# Patient Record
Sex: Female | Born: 1985 | Race: White | Hispanic: No | Marital: Single | Smoking: Current every day smoker
Health system: Southern US, Community
[De-identification: ages and names within clinical notes are randomized; demographics above are authoritative.]

## PROBLEM LIST (undated history)

## (undated) DIAGNOSIS — F319 Bipolar disorder, unspecified: Secondary | ICD-10-CM

## (undated) DIAGNOSIS — K5792 Diverticulitis of intestine, part unspecified, without perforation or abscess without bleeding: Secondary | ICD-10-CM

## (undated) DIAGNOSIS — N159 Renal tubulo-interstitial disease, unspecified: Secondary | ICD-10-CM

## (undated) DIAGNOSIS — N739 Female pelvic inflammatory disease, unspecified: Secondary | ICD-10-CM

## (undated) DIAGNOSIS — F419 Anxiety disorder, unspecified: Secondary | ICD-10-CM

---

## 2003-12-22 ENCOUNTER — Emergency Department: Payer: Self-pay | Admitting: Emergency Medicine

## 2004-07-24 ENCOUNTER — Emergency Department: Payer: Self-pay | Admitting: Emergency Medicine

## 2004-10-13 ENCOUNTER — Emergency Department (HOSPITAL_COMMUNITY): Admission: EM | Admit: 2004-10-13 | Discharge: 2004-10-13 | Payer: Self-pay | Admitting: Emergency Medicine

## 2004-11-20 ENCOUNTER — Emergency Department (HOSPITAL_COMMUNITY): Admission: EM | Admit: 2004-11-20 | Discharge: 2004-11-20 | Payer: Self-pay | Admitting: Emergency Medicine

## 2008-04-18 ENCOUNTER — Emergency Department: Payer: Self-pay | Admitting: Emergency Medicine

## 2008-08-30 ENCOUNTER — Inpatient Hospital Stay: Payer: Self-pay | Admitting: Psychiatry

## 2009-01-02 ENCOUNTER — Inpatient Hospital Stay: Payer: Self-pay | Admitting: Psychiatry

## 2009-04-17 ENCOUNTER — Emergency Department (HOSPITAL_COMMUNITY): Admission: EM | Admit: 2009-04-17 | Discharge: 2009-04-17 | Payer: Self-pay | Admitting: Emergency Medicine

## 2009-06-02 ENCOUNTER — Emergency Department (HOSPITAL_COMMUNITY): Admission: EM | Admit: 2009-06-02 | Discharge: 2009-06-03 | Payer: Self-pay | Admitting: Emergency Medicine

## 2015-04-19 ENCOUNTER — Ambulatory Visit: Payer: Self-pay | Admitting: Family Medicine

## 2016-10-07 ENCOUNTER — Emergency Department
Admission: EM | Admit: 2016-10-07 | Discharge: 2016-10-07 | Disposition: A | Discharge status: Other Acute Inpatient Hospital | Payer: Medicaid Other | Attending: Emergency Medicine | Admitting: Emergency Medicine

## 2016-10-07 ENCOUNTER — Inpatient Hospital Stay
Admission: AD | Admit: 2016-10-07 | Discharge: 2016-10-09 | DRG: 885 | Disposition: A | Discharge status: Home / Self Care | Payer: Medicaid Other | Attending: Psychiatry | Admitting: Psychiatry

## 2016-10-07 ENCOUNTER — Encounter: Payer: Self-pay | Admitting: Emergency Medicine

## 2016-10-07 DIAGNOSIS — F1121 Opioid dependence, in remission: Secondary | ICD-10-CM | POA: Diagnosis present

## 2016-10-07 DIAGNOSIS — F1020 Alcohol dependence, uncomplicated: Secondary | ICD-10-CM

## 2016-10-07 DIAGNOSIS — Z79899 Other long term (current) drug therapy: Secondary | ICD-10-CM | POA: Diagnosis not present

## 2016-10-07 DIAGNOSIS — R45851 Suicidal ideations: Secondary | ICD-10-CM | POA: Insufficient documentation

## 2016-10-07 DIAGNOSIS — F1420 Cocaine dependence, uncomplicated: Secondary | ICD-10-CM | POA: Diagnosis present

## 2016-10-07 DIAGNOSIS — Z9119 Patient's noncompliance with other medical treatment and regimen: Secondary | ICD-10-CM | POA: Diagnosis not present

## 2016-10-07 DIAGNOSIS — F1310 Sedative, hypnotic or anxiolytic abuse, uncomplicated: Secondary | ICD-10-CM

## 2016-10-07 DIAGNOSIS — R079 Chest pain, unspecified: Secondary | ICD-10-CM | POA: Diagnosis present

## 2016-10-07 DIAGNOSIS — F17200 Nicotine dependence, unspecified, uncomplicated: Secondary | ICD-10-CM | POA: Insufficient documentation

## 2016-10-07 DIAGNOSIS — Z915 Personal history of self-harm: Secondary | ICD-10-CM

## 2016-10-07 DIAGNOSIS — F1111 Opioid abuse, in remission: Secondary | ICD-10-CM | POA: Diagnosis present

## 2016-10-07 DIAGNOSIS — F17210 Nicotine dependence, cigarettes, uncomplicated: Secondary | ICD-10-CM | POA: Diagnosis present

## 2016-10-07 DIAGNOSIS — F10920 Alcohol use, unspecified with intoxication, uncomplicated: Secondary | ICD-10-CM | POA: Diagnosis not present

## 2016-10-07 DIAGNOSIS — F1290 Cannabis use, unspecified, uncomplicated: Secondary | ICD-10-CM | POA: Diagnosis present

## 2016-10-07 DIAGNOSIS — Z713 Dietary counseling and surveillance: Secondary | ICD-10-CM

## 2016-10-07 DIAGNOSIS — G4700 Insomnia, unspecified: Secondary | ICD-10-CM | POA: Diagnosis present

## 2016-10-07 DIAGNOSIS — T50902A Poisoning by unspecified drugs, medicaments and biological substances, intentional self-harm, initial encounter: Secondary | ICD-10-CM | POA: Diagnosis not present

## 2016-10-07 DIAGNOSIS — Z23 Encounter for immunization: Secondary | ICD-10-CM | POA: Diagnosis not present

## 2016-10-07 DIAGNOSIS — F314 Bipolar disorder, current episode depressed, severe, without psychotic features: Secondary | ICD-10-CM

## 2016-10-07 DIAGNOSIS — Z888 Allergy status to other drugs, medicaments and biological substances status: Secondary | ICD-10-CM

## 2016-10-07 DIAGNOSIS — R946 Abnormal results of thyroid function studies: Secondary | ICD-10-CM | POA: Diagnosis present

## 2016-10-07 DIAGNOSIS — F419 Anxiety disorder, unspecified: Secondary | ICD-10-CM | POA: Diagnosis present

## 2016-10-07 DIAGNOSIS — Z5181 Encounter for therapeutic drug level monitoring: Secondary | ICD-10-CM

## 2016-10-07 HISTORY — DX: Bipolar disorder, unspecified: F31.9

## 2016-10-07 HISTORY — DX: Anxiety disorder, unspecified: F41.9

## 2016-10-07 LAB — COMPREHENSIVE METABOLIC PANEL
ALBUMIN: 4.1 g/dL (ref 3.5–5.0)
ALK PHOS: 54 U/L (ref 38–126)
ALT: 20 U/L (ref 14–54)
ANION GAP: 13 (ref 5–15)
AST: 19 U/L (ref 15–41)
BUN: 11 mg/dL (ref 6–20)
CALCIUM: 9.1 mg/dL (ref 8.9–10.3)
CHLORIDE: 109 mmol/L (ref 101–111)
CO2: 21 mmol/L — AB (ref 22–32)
CREATININE: 0.61 mg/dL (ref 0.44–1.00)
GFR calc non Af Amer: >60 mL/min (ref >60)
GLUCOSE: 113 mg/dL — AB (ref 65–99)
Potassium: 3.4 mmol/L — ABNORMAL LOW (ref 3.5–5.1)
SODIUM: 143 mmol/L (ref 135–145)
Total Bilirubin: 0.4 mg/dL (ref 0.3–1.2)
Total Protein: 7.6 g/dL (ref 6.5–8.1)

## 2016-10-07 LAB — CBC
HCT: 39.5 % (ref 35.0–47.0)
Hemoglobin: 13.5 g/dL (ref 12.0–16.0)
MCH: 32.5 pg (ref 26.0–34.0)
MCHC: 34.1 g/dL (ref 32.0–36.0)
MCV: 95.3 fL (ref 80.0–100.0)
PLATELETS: 578 10*3/uL — AB (ref 150–440)
RBC: 4.14 MIL/uL (ref 3.80–5.20)
RDW: 12.5 % (ref 11.5–14.5)
WBC: 14.4 10*3/uL — ABNORMAL HIGH (ref 3.6–11.0)

## 2016-10-07 LAB — SALICYLATE LEVEL

## 2016-10-07 LAB — URINE DRUG SCREEN, QUALITATIVE (ARMC ONLY)
AMPHETAMINES, UR SCREEN: NOT DETECTED
BENZODIAZEPINE, UR SCRN: NOT DETECTED
Barbiturates, Ur Screen: NOT DETECTED
Cannabinoid 50 Ng, Ur ~~LOC~~: NOT DETECTED
Cocaine Metabolite,Ur ~~LOC~~: POSITIVE — AB
MDMA (ECSTASY) UR SCREEN: NOT DETECTED
METHADONE SCREEN, URINE: NOT DETECTED
Opiate, Ur Screen: NOT DETECTED
Phencyclidine (PCP) Ur S: NOT DETECTED
Tricyclic, Ur Screen: NOT DETECTED

## 2016-10-07 LAB — ETHANOL: Alcohol, Ethyl (B): 301 mg/dL (ref <5)

## 2016-10-07 LAB — ACETAMINOPHEN LEVEL

## 2016-10-07 LAB — PREGNANCY, URINE: PREG TEST UR: NEGATIVE

## 2016-10-07 LAB — TROPONIN I

## 2016-10-07 MED ORDER — LORAZEPAM 1 MG PO TABS
1.0000 mg | ORAL_TABLET | Freq: Four times a day (QID) | ORAL | Status: DC | PRN
  Administered 2016-10-07: 1 mg via ORAL
  Filled 2016-10-07: qty 1

## 2016-10-07 MED ORDER — BUPRENORPHINE HCL-NALOXONE HCL 8-2 MG SL SUBL
1.0000 | SUBLINGUAL_TABLET | SUBLINGUAL | Status: AC
  Filled 2016-10-07: qty 4

## 2016-10-07 MED ORDER — NICOTINE 21 MG/24HR TD PT24
21.0000 mg | MEDICATED_PATCH | Freq: Every day | TRANSDERMAL | Status: DC
  Administered 2016-10-08 – 2016-10-09 (×2): 21 mg via TRANSDERMAL
  Filled 2016-10-07 (×2): qty 1

## 2016-10-07 MED ORDER — BUPRENORPHINE HCL-NALOXONE HCL 8-2 MG SL SUBL: 1.0000 | SUBLINGUAL_TABLET | Freq: Two times a day (BID) | SUBLINGUAL | Status: DC

## 2016-10-07 MED ORDER — QUETIAPINE FUMARATE 25 MG PO TABS
100.0000 mg | ORAL_TABLET | Freq: Every day | ORAL | Status: DC
  Administered 2016-10-07: 100 mg via ORAL
  Filled 2016-10-07: qty 4

## 2016-10-07 MED ORDER — BUPRENORPHINE HCL-NALOXONE HCL 8-2 MG SL SUBL
1.0000 | SUBLINGUAL_TABLET | SUBLINGUAL | Status: DC
Start: 2016-10-07 — End: 2016-10-07
  Administered 2016-10-07: 1 via SUBLINGUAL
  Filled 2016-10-07: qty 1

## 2016-10-07 MED ORDER — ALUM & MAG HYDROXIDE-SIMETH 200-200-20 MG/5ML PO SUSP
30.0000 mL | ORAL | Status: DC | PRN
  Administered 2016-10-08: 30 mL via ORAL
  Filled 2016-10-07: qty 30

## 2016-10-07 MED ORDER — VITAMIN B-1 100 MG PO TABS
100.0000 mg | ORAL_TABLET | Freq: Every day | ORAL | Status: DC
  Administered 2016-10-08 – 2016-10-09 (×2): 100 mg via ORAL
  Filled 2016-10-07 (×2): qty 1

## 2016-10-07 MED ORDER — MAGNESIUM HYDROXIDE 400 MG/5ML PO SUSP: 30.0000 mL | Freq: Every day | ORAL | Status: DC | PRN

## 2016-10-07 MED ORDER — TRAZODONE HCL 100 MG PO TABS
100.0000 mg | ORAL_TABLET | Freq: Every evening | ORAL | Status: DC | PRN
  Administered 2016-10-08: 100 mg via ORAL
  Filled 2016-10-07: qty 1

## 2016-10-07 MED ORDER — GABAPENTIN 300 MG PO CAPS
900.0000 mg | ORAL_CAPSULE | Freq: Three times a day (TID) | ORAL | Status: DC
  Administered 2016-10-07 (×2): 900 mg via ORAL
  Filled 2016-10-07 (×2): qty 3

## 2016-10-07 MED ORDER — LORAZEPAM 2 MG/ML IJ SOLN: 1.0000 mg | Freq: Four times a day (QID) | INTRAMUSCULAR | Status: DC | PRN

## 2016-10-07 MED ORDER — GABAPENTIN 300 MG PO CAPS
900.0000 mg | ORAL_CAPSULE | Freq: Three times a day (TID) | ORAL | Status: DC
  Administered 2016-10-08 – 2016-10-09 (×6): 900 mg via ORAL
  Filled 2016-10-07 (×6): qty 3

## 2016-10-07 MED ORDER — ADULT MULTIVITAMIN W/MINERALS CH
1.0000 | ORAL_TABLET | Freq: Every day | ORAL | Status: DC
  Administered 2016-10-07: 1 via ORAL
  Filled 2016-10-07: qty 1

## 2016-10-07 MED ORDER — LITHIUM CARBONATE ER 300 MG PO TBCR
300.0000 mg | EXTENDED_RELEASE_TABLET | Freq: Three times a day (TID) | ORAL | Status: DC
  Administered 2016-10-08 – 2016-10-09 (×6): 300 mg via ORAL
  Filled 2016-10-07 (×6): qty 1

## 2016-10-07 MED ORDER — LORAZEPAM 1 MG PO TABS: 1.0000 mg | ORAL_TABLET | Freq: Four times a day (QID) | ORAL | Status: DC | PRN

## 2016-10-07 MED ORDER — THIAMINE HCL 100 MG/ML IJ SOLN
100.0000 mg | Freq: Once | INTRAMUSCULAR | Status: AC
  Administered 2016-10-07: 100 mg via INTRAMUSCULAR
  Filled 2016-10-07: qty 2

## 2016-10-07 MED ORDER — THIAMINE HCL 100 MG/ML IJ SOLN: 100.0000 mg | Freq: Every day | INTRAMUSCULAR | Status: DC

## 2016-10-07 MED ORDER — BUPRENORPHINE HCL-NALOXONE HCL 8-2 MG SL SUBL
1.0000 | SUBLINGUAL_TABLET | Freq: Two times a day (BID) | SUBLINGUAL | Status: DC
  Administered 2016-10-07: 2 via SUBLINGUAL
  Administered 2016-10-08 – 2016-10-09 (×4): 1 via SUBLINGUAL
  Filled 2016-10-07 (×4): qty 1

## 2016-10-07 MED ORDER — VITAMIN B-1 100 MG PO TABS: 100.0000 mg | ORAL_TABLET | Freq: Every day | ORAL | Status: DC

## 2016-10-07 MED ORDER — ACETAMINOPHEN 325 MG PO TABS
650.0000 mg | ORAL_TABLET | Freq: Four times a day (QID) | ORAL | Status: DC | PRN
  Administered 2016-10-07 – 2016-10-09 (×3): 650 mg via ORAL
  Filled 2016-10-07 (×3): qty 2

## 2016-10-07 MED ORDER — LORAZEPAM 2 MG PO TABS
2.0000 mg | ORAL_TABLET | ORAL | Status: AC
  Administered 2016-10-07: 2 mg via ORAL
  Filled 2016-10-07: qty 1

## 2016-10-07 MED ORDER — FOLIC ACID 1 MG PO TABS
1.0000 mg | ORAL_TABLET | Freq: Every day | ORAL | Status: DC
  Administered 2016-10-07: 1 mg via ORAL
  Filled 2016-10-07: qty 1

## 2016-10-07 MED ORDER — NICOTINE 21 MG/24HR TD PT24
21.0000 mg | MEDICATED_PATCH | Freq: Every day | TRANSDERMAL | Status: DC
  Administered 2016-10-07: 21 mg via TRANSDERMAL
  Filled 2016-10-07: qty 1

## 2016-10-07 MED ORDER — ADULT MULTIVITAMIN W/MINERALS CH
1.0000 | ORAL_TABLET | Freq: Every day | ORAL | Status: DC
  Administered 2016-10-08 – 2016-10-09 (×2): 1 via ORAL
  Filled 2016-10-07 (×2): qty 1

## 2016-10-07 MED ORDER — SODIUM CHLORIDE 0.9 % IV BOLUS (SEPSIS)
1000.0000 mL | Freq: Once | INTRAVENOUS | Status: AC
  Administered 2016-10-07: 1000 mL via INTRAVENOUS

## 2016-10-07 MED ORDER — DIPHENHYDRAMINE HCL 25 MG PO CAPS
25.0000 mg | ORAL_CAPSULE | Freq: Once | ORAL | Status: AC
  Administered 2016-10-07: 25 mg via ORAL
  Filled 2016-10-07: qty 1

## 2016-10-07 MED ORDER — LITHIUM CARBONATE ER 300 MG PO TBCR
300.0000 mg | EXTENDED_RELEASE_TABLET | Freq: Three times a day (TID) | ORAL | Status: DC
  Administered 2016-10-07: 300 mg via ORAL
  Filled 2016-10-07: qty 1

## 2016-10-07 MED ORDER — FOLIC ACID 1 MG PO TABS
1.0000 mg | ORAL_TABLET | Freq: Every day | ORAL | Status: DC
  Administered 2016-10-08 – 2016-10-09 (×2): 1 mg via ORAL
  Filled 2016-10-07 (×2): qty 1

## 2016-10-07 NOTE — ED Notes (Addendum)
Pt wedding band tight fitting around finger. Is not able to come off without cutting. Vikki Ports, RN at [employer] pt is able to come over with ring on.  Pt complies to take out hair bow when Theodoro Grist, [place] asks patient. Pt walked over to [facility] by Theodoro Grist and [facility] [facility] with [employer].

## 2016-10-07 NOTE — ED Triage Notes (Signed)
Pt arrived via ems from home after family called police with worries that pt was suicidal. Pt reports taking xanax, using crack , smoked weed, and drank. Pt states her husband died in suicide by cop last week and states she has not slept since. Pt has a small laceration to her left wrist that pt reports was an attempt to commit suicide. Pt does feel  Suicidal but contracts this RN for safety. Pt also states she is exhausted and "just wants to sleep."
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
## 2016-10-07 NOTE — BH Assessment (Signed)
Patient is to be admitted to [facility] by Dr. Toni Amend.  Attending Physician will be Dr. Jennet Maduro.   Patient has been assigned to room 303, by [facility] Charge Nurse Avalon F.   Intake Paper Work has been signed and placed on patient chart.  ER staff is aware of the admission Salley Scarlet, ER Sect.; Dr. Mittie Bodo, ER MD; Vikki Ports, Patient's Nurse & Ola Spurr  Patient Access).

## 2016-10-07 NOTE — ED Notes (Signed)
Patient talking on phone. °

## 2016-10-07 NOTE — ED Provider Notes (Addendum)
[facility] Emergency Department Provider Note   ____________________________________________   First MD Initiated Contact with Patient 10/07/16 0120     (approximate)  I have reviewed the triage vital signs and the nursing notes.   HISTORY  Chief Complaint Chest Pain and Suicidal    HPI Tonya Huff is a 31 y.o. female who comes into the hospital today after overdosing on medication and alcohol. According to EMS the patient took Xanax, crack, weed and possibly alcohol. The patient's family called EMS. The patient has a small laceration to her wrist but told EMS that she was having a suicide attempt. The patient's husband was killed by policerecently. The patient is having difficulty coping. She also takes Suboxone. She shoots up cocaine. She does have some chest pain that she states is been all day. The patiently to pain a 6 out of 10 in intensity. She reports that she has not slept since Thursday. She states that she just wants to sleep but also states that she does want to kill herself. She reports that she drank a lot of alcohol.   Past Medical History:  Diagnosis Date  . Anxiety   . Bipolar 1 disorder ([name])     There are no active problems to display for this patient.   History reviewed. No pertinent surgical history.  Prior to Admission medications   Medication Sig Start Date End Date Taking? Authorizing Provider  buprenorphine (SUBUTEX) 2 MG SUBL SL tablet Place 4 mg under the tongue daily.   Yes [provider]  gabapentin (NEURONTIN) 400 MG capsule Take 400 mg by mouth 3 (three) times daily.   Yes [provider]  lithium carbonate 300 MG capsule Take 300 mg by mouth 3 (three) times daily with meals.   Yes [provider]    Allergies Haldol [haloperidol]  No family history on file.  Social History Social History  Substance Use Topics  . Smoking status: Current Every Day Smoker  . Smokeless tobacco:  Never Used  . Alcohol use Yes    Review of Systems  Constitutional: No fever/chills Eyes: No visual changes. ENT: No sore throat. Cardiovascular:  chest pain. Respiratory: Denies shortness of breath. Gastrointestinal: No abdominal pain.  No nausea, no vomiting.  No diarrhea.  No constipation. Genitourinary: Negative for dysuria. Musculoskeletal: Negative for back pain. Skin: Negative for rash. Laceration to left wrist Neurological: Negative for headaches, focal weakness or numbness. Psych: Emotional crisis, suicidal ideation  ____________________________________________   PHYSICAL EXAM:  VITAL SIGNS: ED Triage Vitals  Enc Vitals Group     BP 10/07/16 0127 (!) 132/91     Pulse Rate 10/07/16 0127 97     Resp 10/07/16 0127 14     Temp 10/07/16 0127 98.3 F (36.8 C)     Temp Source 10/07/16 0127 Oral     SpO2 10/07/16 0127 98 %     Weight 10/07/16 0127 160 lb (72.6 kg)     Height 10/07/16 0127 5\' 2"  (1.575 m)     Head Circumference --      Peak Flow --      Pain Score 10/07/16 0128 6     Pain Loc --      Pain Edu? --      Excl. in GC? --     Constitutional: Alert and oriented. Well appearing and in moderate distress. crying Eyes: Conjunctivae are normal. PERRL. EOMI. Head: Atraumatic. Nose: No congestion/rhinnorhea. Mouth/Throat: Mucous membranes are moist.  Oropharynx non-erythematous. Cardiovascular:  Normal rate, regular rhythm. Grossly normal heart sounds.  Good peripheral circulation. Respiratory: Normal respiratory effort.  No retractions. Lungs CTAB. Gastrointestinal: Soft and nontender. No distention. Positive bowel sounds Musculoskeletal: No lower extremity tenderness nor edema.   Neurologic:  Normal speech and language. Skin:  Skin is warm, dry and 3.5cm left wrist laceration of indeterminate age. Psychiatric: inappropriate affect, patient laughing inappropriately during exam  ____________________________________________   LABS (all labs ordered are  listed, but only abnormal results are displayed)  Labs Reviewed  COMPREHENSIVE METABOLIC PANEL - Abnormal; Notable for the following:       Result Value   Potassium 3.4 (*)    CO2 21 (*)    Glucose, Bld 113 (*)    All other components within normal limits  ETHANOL - Abnormal; Notable for the following:    Alcohol, Ethyl (B) 301 (*)    All other components within normal limits  ACETAMINOPHEN LEVEL - Abnormal; Notable for the following:    Acetaminophen (Tylenol), Serum <10 (*)    All other components within normal limits  CBC - Abnormal; Notable for the following:    WBC 14.4 (*)    Platelets 578 (*)    All other components within normal limits  URINE DRUG SCREEN, QUALITATIVE ([facility] ONLY) - Abnormal; Notable for the following:    Cocaine Metabolite,Ur [place] POSITIVE (*)    All other components within normal limits  SALICYLATE LEVEL  TROPONIN I  TROPONIN I  PREGNANCY, URINE   ____________________________________________  EKG  ED ECG REPORT I, Rebecka ApleyWebster,  Mckinley Adelstein P, the attending physician, personally viewed and interpreted this ECG.   Date: 10/07/2016  EKG Time: 0130  Rate: 93  Rhythm: normal sinus rhythm  Axis: normal  Intervals:none  ST&T Change: none  ____________________________________________  RADIOLOGY  No results found.  ____________________________________________   PROCEDURES  Procedure(s) performed: None  Procedures  Critical Care performed: No  ____________________________________________   INITIAL IMPRESSION / ASSESSMENT AND PLAN / ED COURSE  Pertinent labs & imaging results that were available during my care of the patient were reviewed by me and considered in my medical decision making (see chart for details).  This is a 31 year old female who comes into the hospital with an overdose. The patient states that her husband died recently and she was on the phone with him. She's been having a hard time coping. The patient did take some cocaine and  quite a bit of alcohol. We will IVC the patient and have the patient evaluated by psych.    The patient does have a laceration on her left wrist but she states that she is unsure when she did that. It has a Band-Aid on it at this time it is not actively bleeding and the edges are approximated. We will continue to monitor.  ____________________________________________   FINAL CLINICAL IMPRESSION(S) / ED DIAGNOSES  Final diagnoses:  Intentional drug overdose, initial encounter ([name])  Suicidal ideation  Alcoholic intoxication without complication ([name])      NEW MEDICATIONS STARTED DURING THIS VISIT:  New Prescriptions   No medications on file     Note:  This document was prepared using Dragon voice recognition software and may include unintentional dictation errors.    Rebecka ApleyWebster, Neala Miggins P, MD 10/07/16 0754    Rebecka ApleyWebster, Charlen Bakula P, MD 10/07/16 [phone]

## 2016-10-07 NOTE — ED Notes (Addendum)
Patient laying in bed awake. Patient is calm and cooperative.

## 2016-10-07 NOTE — ED Notes (Signed)
Pt. Alert and oriented, warm and dry, in no distress. Pt. Denies HI, and AH. Pt states having SI without a plan, patient able to contract for safety with this Clinical research associatewriter. Patient states seeing shadows but thinks this is because she is unable to sleep. Patient states husband committed suicide by police. Patient states she just get what he told her before he pasted away, "I love you". Pt. Encouraged to let nursing staff know of any concerns or needs.

## 2016-10-07 NOTE — ED Notes (Signed)
Patient awake resting in bed. Lunch brought to patient.

## 2016-10-07 NOTE — BH Assessment (Signed)
Assessment Note  Jene EveryJennifer Krisher is an 31 y.o. female presenting to the ED via [facility] after her family called with concerns that patient is suicidal.  Patient reports being depressed after her husband died last week by suicide by police. Patient took an overdose of  xanax, crack , weed, and alcohol. Pt also reports cutting her wrist ain an attempt to end her life.   Diagnosis: Depressive Disorder  Past Medical History:  Past Medical History:  Diagnosis Date  . Anxiety   . Bipolar 1 disorder ([name])     History reviewed. No pertinent surgical history.  Family History: No family history on file.  Social History:  reports that she has been smoking.  She has never used smokeless tobacco. She reports that she drinks alcohol. She reports that she uses drugs, including Marijuana, Cocaine, and IV.  Additional Social History:  Alcohol / Drug Use Pain Medications: See PTA Prescriptions: See PTA Over the Counter: See PTA History of alcohol / drug use?: Yes Negative Consequences of Use: Personal relationships  CIWA: CIWA-Ar BP: 126/84 Pulse Rate: (!) 115 COWS:    Allergies:  Allergies  Allergen Reactions  . Haldol [Haloperidol]     Home Medications:  (Not in a hospital admission)  OB/GYN Status:  Patient's last menstrual period was 10/04/2016.  General Assessment Data [facility] Assessment: In system Is this a Tele or Face-to-Face Assessment?: Face-to-Face Is this an Initial Assessment or a Re-assessment for this encounter?: Initial Assessment Marital status: Single Maiden name: n/a Is patient pregnant?: No Pregnancy Status: No Living Arrangements: Other relatives Can pt return to current living arrangement?: Yes Admission Status: Involuntary Is patient capable of signing voluntary admission?: No Referral Source: Self/Family/Friend Insurance type: None     Crisis Care Plan Living Arrangements: Other relatives Legal Guardian: Other: (self) Name  of Psychiatrist: unknown Name of Therapist: unknown  Education Status Is patient currently in school?: No Current Grade: na Highest grade of school patient has completed: na Name of school: na Contact person: na  Risk to self with the past 6 months Suicidal Ideation: Yes-Currently Present Has patient been a risk to self within the past 6 months prior to admission? : No Suicidal Intent: Yes-Currently Present Has patient had any suicidal intent within the past 6 months prior to admission? : No Is patient at risk for suicide?: Yes Suicidal Plan?: Yes-Currently Present Has patient had any suicidal plan within the past 6 months prior to admission? : No Specify Current Suicidal Plan: Pt took an overdose of drugs and cut her wrists Access to Means: Yes Specify Access to Suicidal Means: Pt has access to drugs What has been your use of drugs/alcohol within the last 12 months?: cocaine, benzos Previous Attempts/Gestures: No Other Self Harm Risks: none idendtified Triggers for Past Attempts: None known Intentional Self Injurious Behavior: Cutting Comment - Self Injurious Behavior: Pt cut her wrists Family Suicide History: Yes Recent stressful life event(s): Loss (Comment), Trauma (Comment) (death of boyfriend) Persecutory voices/beliefs?: Yes Depression: Yes Depression Symptoms: Despondent, Loss of interest in usual pleasures, Feeling worthless/self pity Substance abuse history and/or treatment for substance abuse?: Yes Suicide prevention information given to non-admitted patients: Not applicable  Risk to Others within the past 6 months Homicidal Ideation: No Does patient have any lifetime risk of violence toward others beyond the six months prior to admission? : No Thoughts of Harm to Others: No Current Homicidal Intent: No Current Homicidal Plan: No Access to Homicidal Means: No Identified Victim:  none identified History of harm to others?: No Assessment of Violence: None  Noted Violent Behavior Description: none identified Does patient have access to weapons?: No Criminal Charges Pending?: No Does patient have a court date: No Is patient on probation?: No  Psychosis Hallucinations: None noted Delusions: None noted  Mental Status Report Appearance/Hygiene: In scrubs Eye Contact: Poor Motor Activity: Freedom of movement Speech: Logical/coherent Level of Consciousness: Drowsy Mood: Depressed Affect: Depressed Anxiety Level: Minimal Thought Processes: Relevant Judgement: Impaired Orientation: Person, Time, Place, Situation Obsessive Compulsive Thoughts/Behaviors: None  Cognitive Functioning Concentration: Normal Memory: Recent Intact, Remote Intact IQ: Average Insight: Poor Impulse Control: Poor Appetite: Poor Sleep: Decreased Total Hours of Sleep: 4 Vegetative Symptoms: None  ADLScreening [facility] Assessment Services) Patient's cognitive ability adequate to safely complete daily activities?: Yes Patient able to express need for assistance with ADLs?: Yes Independently performs ADLs?: Yes (appropriate for developmental age)  Prior Inpatient Therapy Prior Inpatient Therapy: No Prior Therapy Dates: na Prior Therapy Facilty/Provider(s): na Reason for Treatment: na  Prior Outpatient Therapy Prior Outpatient Therapy: No Prior Therapy Dates: na Prior Therapy Facilty/Provider(s): na Reason for Treatment: na Does patient have an ACCT team?: No Does patient have Intensive In-House Services?  : No Does patient have [facility] services? : No Does patient have P4CC services?: No  ADL Screening (condition at time of admission) Patient's cognitive ability adequate to safely complete daily activities?: Yes Patient able to express need for assistance with ADLs?: Yes Independently performs ADLs?: Yes (appropriate for developmental age)       Abuse/Neglect Assessment (Assessment to be complete while patient is alone) Physical Abuse: Denies Verbal  Abuse: Denies Sexual Abuse: Denies Exploitation of patient/patient's resources: Denies Self-Neglect: Denies Values / Beliefs Cultural Requests During Hospitalization: None Spiritual Requests During Hospitalization: None Consults Spiritual Care Consult Needed: No Social Work Consult Needed: No Merchant navy officer (For Healthcare) Does Patient Have a Medical Advance Directive?: No    Additional Information 1:1 In Past 12 Months?: No CIRT Risk: No Elopement Risk: No Does patient have medical clearance?: Yes     Disposition:  Disposition Initial Assessment Completed for this Encounter: Yes Disposition of Patient: Other dispositions Other disposition(s): Other (Comment) (Pending Psych MD consult)  On Site Evaluation by:   Reviewed with Physician:    Artist [place] 10/07/2016 6:12 AM

## 2016-10-07 NOTE — ED Notes (Signed)
Pt given water and TV remote. Sitter at bedside and attentive to patient needs.

## 2016-10-07 NOTE — ED Notes (Signed)
[facility]. Pt wanded and oriented to unit. Pt answers questions appropriately but is guarded in her behavior, having poor eye contact. When asked about SI, pt shakes her head 'no', and agrees to contract for safety by nodding her head. Pt reports not having slept because of losing her husband a month ago. Pt given toothpaste and toothbrush to brush her teeth and an extra blanket per pt's request. Patient now resting in bed watching tv. Will continue to monitor pt with 15 minute checks.

## 2016-10-07 NOTE — ED Notes (Signed)

## 2016-10-07 NOTE — ED Notes (Signed)
Patient requesting something to help her "get a little sleep".  Reassured pt that this writer will speak with the ED Dr.

## 2016-10-07 NOTE — ED Notes (Signed)
Sitter at bedside.

## 2016-10-07 NOTE — ED Notes (Addendum)
Pt sitting up in bed watching TV and drinking water.  Pt does not appear intoxication. Pt answering questions appropriately. Pt cooperative, calm.

## 2016-10-07 NOTE — ED Notes (Signed)
Patient is pacing in dayroom. Patient is calm at this time asking for cups of ice.

## 2016-10-07 NOTE — ED Notes (Signed)
Pt asks for something to help her sleep. Explained to pt that we cannot give her that medication due to her alcohol level. Pt denies any other needs. Sitter at bedside.

## 2016-10-07 NOTE — ED Notes (Signed)
Report called Maxine Glennikki Rn in the [facility]U. [facility]. Patient aware of transfer and is in agreement with treatment. Patient currently calm and cooperative. 2200 medications given with no issues.

## 2016-10-07 NOTE — ED Provider Notes (Signed)
Dr. Clapacs recommends inpatient stabilization..   Vonna Brabson, MD 10/07/16 1611  

## 2016-10-07 NOTE — ED Notes (Addendum)
Pt changed into behavioral scrubs by sitter. Pt refusing to take off wedding ring and hair tie. Explained to patient policy and patient become irritated, stating "well what do you want me to put my hair up with?"  Pt still refusing. Will attempt for another staff member to have patient comply. Consulting civil engineerCharge RN notified.

## 2016-10-08 DIAGNOSIS — F1111 Opioid abuse, in remission: Secondary | ICD-10-CM | POA: Diagnosis present

## 2016-10-08 DIAGNOSIS — F314 Bipolar disorder, current episode depressed, severe, without psychotic features: Principal | ICD-10-CM

## 2016-10-08 DIAGNOSIS — F17200 Nicotine dependence, unspecified, uncomplicated: Secondary | ICD-10-CM | POA: Diagnosis present

## 2016-10-08 LAB — URINE DRUG SCREEN, QUALITATIVE (ARMC ONLY)
AMPHETAMINES, UR SCREEN: NOT DETECTED
BARBITURATES, UR SCREEN: NOT DETECTED
Benzodiazepine, Ur Scrn: POSITIVE — AB
COCAINE METABOLITE, UR ~~LOC~~: NOT DETECTED
Cannabinoid 50 Ng, Ur ~~LOC~~: NOT DETECTED
MDMA (ECSTASY) UR SCREEN: NOT DETECTED
METHADONE SCREEN, URINE: NOT DETECTED
Opiate, Ur Screen: NOT DETECTED
Phencyclidine (PCP) Ur S: NOT DETECTED
TRICYCLIC, UR SCREEN: NOT DETECTED

## 2016-10-08 LAB — LIPID PANEL
CHOLESTEROL: 163 mg/dL (ref 0–200)
HDL: 41 mg/dL (ref >40)
LDL Cholesterol: 77 mg/dL (ref 0–99)
TRIGLYCERIDES: 227 mg/dL — AB (ref <150)
Total CHOL/HDL Ratio: 4 RATIO
VLDL: 45 mg/dL — ABNORMAL HIGH (ref 0–40)

## 2016-10-08 LAB — URINALYSIS, COMPLETE (UACMP) WITH MICROSCOPIC
BACTERIA UA: NONE SEEN
BILIRUBIN URINE: NEGATIVE
Glucose, UA: NEGATIVE mg/dL
Hgb urine dipstick: NEGATIVE
KETONES UR: NEGATIVE mg/dL
LEUKOCYTES UA: NEGATIVE
NITRITE: NEGATIVE
PH: 7 (ref 5.0–8.0)
PROTEIN: NEGATIVE mg/dL
RBC / HPF: NONE SEEN RBC/hpf (ref 0–5)
Specific Gravity, Urine: 1.008 (ref 1.005–1.030)

## 2016-10-08 LAB — TSH: TSH: 5.034 u[IU]/mL — ABNORMAL HIGH (ref 0.350–4.500)

## 2016-10-08 LAB — HEMOGLOBIN A1C
HEMOGLOBIN A1C: 5.4 % (ref 4.8–5.6)
MEAN PLASMA GLUCOSE: 108.28 mg/dL

## 2016-10-08 LAB — T4, FREE: Free T4: 0.91 ng/dL (ref 0.61–1.12)

## 2016-10-08 MED ORDER — CHLORDIAZEPOXIDE HCL 25 MG PO CAPS
25.0000 mg | ORAL_CAPSULE | Freq: Four times a day (QID) | ORAL | Status: DC
  Administered 2016-10-08 (×2): 25 mg via ORAL
  Filled 2016-10-08 (×2): qty 1

## 2016-10-08 MED ORDER — WHITE PETROLATUM GEL: Status: DC | PRN

## 2016-10-08 MED ORDER — WHITE PETROLATUM GEL
Status: AC
  Administered 2016-10-08: 10:00:00
  Filled 2016-10-08: qty 5

## 2016-10-08 MED ORDER — PNEUMOCOCCAL VAC POLYVALENT 25 MCG/0.5ML IJ INJ
0.5000 mL | INJECTION | INTRAMUSCULAR | Status: DC
  Filled 2016-10-08 (×2): qty 0.5

## 2016-10-08 NOTE — Tx Team (Signed)
Initial Treatment Plan 10/08/2016 12:54 AM Tonya Huff [id number]    PATIENT STRESSORS: Loss of husband   PATIENT STRENGTHS: Active sense of humor Capable of independent living Communication skills Supportive family/friends   PATIENT IDENTIFIED PROBLEMS: Depression  Anxiety   Grieving  Alcohol abuse  Substance abuse  SI           DISCHARGE CRITERIA:  Improved stabilization in mood, thinking, and/or behavior Verbal commitment to aftercare and medication compliance  PRELIMINARY DISCHARGE PLAN: Attend 12-step recovery group Outpatient therapy Return to previous living arrangement  PATIENT/FAMILY INVOLVEMENT: This treatment plan has been presented to and reviewed with the patient, Tonya EveryJennifer Quadros. The patient and family have been given the opportunity to ask questions and make suggestions.  Chancy HurterNichole  Moustafa Mossa, RN 10/08/2016, 12:54 AM

## 2016-10-08 NOTE — Progress Notes (Signed)
Tony[facility] Huff 31 [facility] Depressive disorder, bipolar 1 disorder, anxiety, alcohol abuse, substance abuse, sedative abuse and SI. Alert and oriented x 4. Pt husband was shot and killed by police 3 weeks ago, pt believes it was suicide by police. Pt states, "My husband's last word were I love you". Pt says due to husband's death she has come increasingly depressed. She started she drinking a 5th of liquor everyday for the last month. Pt's family called the police concern about the patient being suicidal. Pt transported by EMS. Pt says she doesn't remember being in the hospital due to being intoxicated. Pt denies SI,HI or A/V hallucinations at this time. Pt verbally contracted to safety. Pt has hx of attempted suicide, pt says 3 years ago she attempted suicide by cutting her wrist, she states she passed out after cutting the first wrist so decided not to continue and cut her other wrist. Pt has hx of several psychiatric admissions.   A- Skin assessment completed with 2nd nurse [facility]([place] RN). Skin intact, no abnormal areas or marks noted. Pt searched for contraband, none found. Pt does have 6 tattoos, 1 behind left ear, 2 on the upper back, 1 on lower back, 1 on right calf, and 1 to left wrist, no signs of infection noted. POC and unit policies explained to pt. Pt verbalized understanding. Consents obtained. Food and fluids offered and given to pt.  R- Pt encouraged to contact staff for any questions or concerns.

## 2016-10-08 NOTE — Progress Notes (Signed)
Patient was depressed and irritable this morning.Patient was rude to staff but apologized for her action.Denies any withdrawal symptoms and suicidal or homicidal ideations.Attended groups.Compliant with medications.Appropriate with staff & peers.Support & encouragement given.

## 2016-10-08 NOTE — BHH Suicide Risk Assessment (Signed)
[facility] Admission Suicide Risk Assessment   Nursing information obtained from:    Demographic factors:    Current Mental Status:    Loss Factors:    Historical Factors:    Risk Reduction Factors:     Total Time spent with patient: 1 hour Principal Problem: Bipolar I disorder, most recent episode depressed, severe without psychotic features ([name]) Diagnosis:   Patient Active Problem List   Diagnosis Date Noted  . Tobacco use disorder [F17.200] 10/08/2016  . Opioid use disorder, mild, in early remission, on maintenance therapy, abuse [F11.11] 10/08/2016  . Alcohol use disorder, moderate, dependence ([name]) [F10.20] 10/07/2016  . Cocaine use disorder, moderate, dependence ([name]) [F14.20] 10/07/2016  . 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 Suicidal ideation [R45.851] 10/07/2016  . Bipolar I disorder, most recent episode depressed, severe without psychotic features ([name]) [F31.4] 10/07/2016   Subjective Data: suicidal ideation.  Continued Clinical Symptoms:  Alcohol Use Disorder Identification Test Final Score (AUDIT): 34 The "Alcohol Use Disorders Identification Test", Guidelines for Use in Primary Care, Second Edition.  World Science writer [name]). Score between 0-7:  no or low risk or alcohol related problems. Score between 8-15:  moderate risk of alcohol related problems. Score between 16-19:  high risk of alcohol related problems. Score 20 or above:  warrants further diagnostic evaluation for alcohol dependence and treatment.   CLINICAL FACTORS:   Bipolar Disorder:   Depressive phase Depression:   Comorbid alcohol abuse/dependence Impulsivity Insomnia Alcohol/Substance Abuse/Dependencies   Musculoskeletal: Strength & Muscle Tone: within normal limits Gait & Station: normal Patient leans: N/A  Psychiatric Specialty Exam: Physical Exam  Nursing note and vitals reviewed. Psychiatric: Her speech is normal and behavior is normal. Thought content normal. Her affect is labile.  Cognition and memory are normal. She expresses impulsivity. She exhibits a depressed mood.    Review of Systems  Psychiatric/Behavioral: Positive for depression, substance abuse and suicidal ideas. The patient has insomnia.   All other systems reviewed and are negative.   Blood pressure 129/87, pulse 95, temperature 97.8 F (36.6 C), temperature source Oral, resp. rate 18, height 5\' 3"  (1.6 m), weight 69.4 kg (153 lb), last menstrual period 10/04/2016, SpO2 100 %.Body mass index is 27.1 kg/m.  General Appearance: Casual  Eye Contact:  Good  Speech:  Clear and Coherent  Volume:  Normal  Mood:  Depressed  Affect:  Tearful  Thought Process:  Goal Directed and Descriptions of Associations: Intact  Orientation:  Full (Time, Place, and Person)  Thought Content:  WDL  Suicidal Thoughts:  No  Homicidal Thoughts:  No  Memory:  Immediate;   Fair Recent;   Fair Remote;   Fair  Judgement:  Impaired  Insight:  Shallow  Psychomotor Activity:  Normal  Concentration:  Concentration: Fair and Attention Span: Fair  Recall:  [employer] of Knowledge:  Fair  Language:  Fair  Akathisia:  No  Handed:  Right  AIMS (if indicated):     Assets:  Communication Skills Desire for Improvement Financial Resources/Insurance Housing Physical Health Resilience Social Support  ADL's:  Intact  Cognition:  WNL  Sleep:  Number of Hours: 4.15      COGNITIVE FEATURES THAT CONTRIBUTE TO RISK:  None    SUICIDE RISK:   Moderate:  Frequent suicidal ideation with limited intensity, and duration, some specificity in terms of plans, no associated intent, good self-control, limited dysphoria/symptomatology, some risk factors present, and identifiable protective factors, including available and accessible social support.  PLAN OF CARE: hospital  admission, medication management, substance abuse counseling, discharge planning.  Ms. Neysa BonitoChristy is a 31 year old female with a history of bipolar disorder and substance  abuse on Suboxone maintenance admitted for suicidal ideation in the context of major loss and relapse on drugs.  1. Suicidal ideation. The patient is able to contract for safety in the hospital.  2. Mood. She was restarted on Lithium and Neurontin for mood stabilization.   3. Opioid dependence. We continue Suboxone.  4. Alcohol abuse. The patient started drinking heavily following her husbands death. She denies symptoms of alcohol withdrawal and complains that Librium makes her sleepy. We will discontinue.   5. Substance abuse. She is very much [facility].  6. Smoking. Nicotine patch is available.  7. Insomnia. She slept ebtter with Trazodone.  8. Disposition. She will be discharged to home with a friend. She would like to attent her husband memorial service on Sunday. She has a follow up [facility]Bar[facility], at 9:00 am.   I certify that inpatient services furnished can reasonably be expected to improve the patient's condition.   Kristine LineaJolanta Amir Fick, MD 10/08/2016, 3:20 PM
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
## 2016-10-08 NOTE — Plan of Care (Signed)
Problem: Activity: Goal: Interest or engagement in leisure activities will improve Outcome: Progressing Patient participated in group activities.  Problem: Education: Goal: Utilization of techniques to improve thought processes will improve Outcome: Progressing Patient is receptive with therapeutic conversations.  Problem: Safety: Goal: Ability to disclose and discuss suicidal ideas will improve Outcome: Progressing Patient free of injury.

## 2016-10-08 NOTE — Progress Notes (Signed)
Pt educated on alcohol abuse and CIWA order.

## 2016-10-08 NOTE — [facility] Group Notes (Signed)
[facility] LCSW Group Therapy   10/08/2016 9:30 am   Type of Therapy: Group Therapy   Participation Level: Patient invited but did not attend.   Khalessi Blough, MSW, LCSW-A 10/08/2016, 10:15AM   

## 2016-10-08 NOTE — Progress Notes (Signed)
Recreation Therapy Notes  At approximately 3:55 pm, LRT attempted assessment. Patient was resting and requested for LRT to come back tomorrow to complete assessment.  Jacquelynn CreeGreene,Emmah Bratcher M, LRT/CTRS 10/08/2016 4:11 PM

## 2016-10-08 NOTE — Progress Notes (Signed)
Recreation Therapy Notes  Date: 08.30.18 Time: 1:00 pm Location: Craft Room  Group Topic: Leisure Education  Goal Area(s) Addresses:  Patient will identify activities for each letter of the alphabet. Patient will verbalize ability to integrate positive leisure into life post d/c. Patient will verbalize ability to use leisure as a Associate Professorcoping skill.  Behavioral Response: Did not attend  Intervention: Leisure Education  Activity: Patients were given a Leisure Information systems managerAlphabet worksheet and were instructed to write healthy leisure activities for each letter of the alphabet.  Education: LRT educated patients on what they need to participate in leisure.   Education Outcome: Patient did not attend group.  Clinical Observations/Feedback: Patient did not attend group.   Jacquelynn CreeGreene,Annastyn Silvey M, LRT/CTRS 10/08/2016 1:42 PM

## 2016-10-09 MED ORDER — TRAZODONE HCL 150 MG PO TABS: 150.0000 mg | ORAL_TABLET | Freq: Every day | ORAL | 1 refills | Status: AC

## 2016-10-09 MED ORDER — GABAPENTIN 300 MG PO CAPS: 900.0000 mg | ORAL_CAPSULE | Freq: Three times a day (TID) | ORAL | 1 refills | Status: AC

## 2016-10-09 MED ORDER — TRAZODONE HCL 50 MG PO TABS: 150.0000 mg | ORAL_TABLET | Freq: Every day | ORAL | Status: DC

## 2016-10-09 MED ORDER — LITHIUM CARBONATE 300 MG PO CAPS: 300.0000 mg | ORAL_CAPSULE | Freq: Three times a day (TID) | ORAL | 1 refills | Status: AC

## 2016-10-09 NOTE — [facility] Suicide Risk Assessment (Signed)
[facility] INPATIENT:  Family/Significant Other Suicide Prevention Education  Suicide Prevention Education:  Education Completed; Tonya Huff, friend, [phone], has been identified by the patient as the family member/significant other with whom the patient will be residing, and identified as the person(s) who will aid the patient in the event of a mental health crisis (suicidal ideations/suicide attempt).  With written consent from the patient, the family member/significant other has been provided the following suicide prevention education, prior to the and/or following the discharge of the patient.  The suicide prevention education provided includes the following:  Suicide risk factors  Suicide prevention and interventions  National Suicide Hotline telephone number  [facility] assessment telephone number  [facility] Emergency Assistance 911  [facility] and/or Residential Mobile Crisis Unit telephone number  Request made of family/significant other to:  Remove weapons (e.g., guns, rifles, knives), all items previously/currently identified as safety concern.  Tonya Huff reports he is a felon and cannot possess weapons and he does not have any guns.  Remove drugs/medications (over-the-counter, prescriptions, illicit drugs), all items previously/currently identified as a safety concern. Tonya Huff was asked to secure all medication.  The family member/significant other verbalizes understanding of the suicide prevention education information provided.  The family member/significant other agrees to remove the items of safety concern listed above.  Tonya Huff, Tonya Williamsen Jon, LCSW 10/09/2016, 12:03 PM

## 2016-10-09 NOTE — Progress Notes (Signed)
[facility]  10/09/2016 9:59 AM Tonya Huff  MRN:  [id number]  Subjective:  Tonya Huff is a 31 year old female with a history of bipolar illness and substance abuse, on Suboxone, admitted for suicidal ideation while drunk in the context of major loss, treatment noncompliance and re;apse on substances. Her husband was shot by police 3 weeks ago. Restarted on Lithium, Neurontin, Suboxone. Her husband Tonya Huff is on Sunday and she would like to leave Saturday afternoon after she takes Suboxone here. She has appointment with Suboxone clinic on Tuesday at 9:00 am.  10/09/2016. Tonya Huff feels sad today but is not tearful. She was restarted on her medications, including Suboxone and seems to tolerate them well. She complaints of "whole body ache" but does believe it is from withdrawal. She participates in programming. She slept well last night. She still hopes to be discharged tomorrow afternoon, after her second dose of Suboxone, in time to attend her husband's funeral on Sunday.   Per nursing: Patient was depressed and irritable this morning.Patient was rude to staff but apologized for her action.Denies any withdrawal symptoms and suicidal or homicidal ideations.Attended groups.Compliant with medications.Appropriate with staff & peers.Support & encouragement given.  Principal Problem: Bipolar I disorder, most recent episode depressed, severe without psychotic features ([name]) Diagnosis:   Patient Active Problem List   Diagnosis Date Noted  . Tobacco use disorder [F17.200] 10/08/2016  . Opioid use disorder, mild, in early remission, on maintenance therapy, abuse [F11.11] 10/08/2016  . Alcohol use disorder, moderate, dependence ([name]) [F10.20] 10/07/2016  . Cocaine use disorder, moderate, dependence ([name]) [F14.20] 10/07/2016  . 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 Suicidal ideation [R45.851] 10/07/2016  . Bipolar I disorder, most recent episode depressed, severe without psychotic  features ([name]) [F31.4] 10/07/2016   Total Time spent with patient: 30 minutes  Past Psychiatric History: bipolar illness, substance abuse.  Past Medical History:  Past Medical History:  Diagnosis Date  . Anxiety   . Bipolar 1 disorder ([name])    History reviewed. No pertinent surgical history. Family History: History reviewed. No pertinent family history. Family Psychiatric  History: depression, substance abuse. Social History:  History  Alcohol Use  . 6.0 oz/week  . 10 Shots of liquor per week     History  Drug Use  . Types: Marijuana, Cocaine, IV    Comment: Marijuana twice a month, cocaine one time    Social History   Social History  . Marital status: Married    Spouse name: N/A  . Number of children: N/A  . Years of education: N/A   Social History Main Topics  . Smoking status: Current Every Day Smoker    Packs/day: 2.00    Years: 10.00    Types: Cigarettes  . Smokeless tobacco: Never Used     Comment: Pt would like the nicotine gum  . Alcohol use 6.0 oz/week    10 Shots of liquor per week  . Drug use: Yes    Types: Marijuana, Cocaine, IV     Comment: Marijuana twice a month, cocaine one time  . Sexual activity: No   Other Topics Concern  . None   Social History Narrative  . None   Additional Social History:                         Sleep: Poor  Appetite:  Fair  Current Medications: Current Facility-Administered Medications  Medication Dose Route Frequency Provider Last Rate Last Dose  . acetaminophen (  TYLENOL) tablet 650 mg  650 mg Oral Q6H PRN Clapacs, Jackquline Denmark, MD   650 mg at 10/09/16 1610  . alum & mag hydroxide-simeth (MAALOX/MYLANTA) 200-200-20 MG/5ML suspension 30 mL  30 mL Oral Q4H PRN Clapacs, Jackquline Denmark, MD   30 mL at 10/08/16 2224  . buprenorphine-naloxone (SUBOXONE) 8-2 mg per SL tablet 1 tablet  1 tablet Sublingual BID AC Clapacs, Jackquline Denmark, MD   1 tablet at 10/09/16 [phone]  . folic acid (FOLVITE) tablet 1 mg  1 mg Oral Daily Clapacs,  Jackquline Denmark, MD   1 mg at 10/09/16 [phone]  . gabapentin (NEURONTIN) capsule 900 mg  900 mg Oral TID Clapacs, Jackquline Denmark, MD   900 mg at 10/09/16 [phone]  . lithium carbonate (LITHOBID) CR tablet 300 mg  300 mg Oral TID WC Clapacs, Jackquline Denmark, MD   300 mg at 10/09/16 0842  . magnesium hydroxide (MILK OF MAGNESIA) suspension 30 mL  30 mL Oral Daily PRN Clapacs, John T, MD      . multivitamin with minerals tablet 1 tablet  1 tablet Oral Daily Clapacs, Jackquline Denmark, MD   1 tablet at 10/09/16 [phone]  . nicotine (NICODERM CQ - dosed in mg/24 hours) patch 21 mg  21 mg Transdermal Daily Clapacs, Jackquline Denmark, MD   21 mg at 10/09/16 0841  . pneumococcal 23 valent vaccine (PNU-IMMUNE) injection 0.5 mL  0.5 mL Intramuscular Tomorrow-1000 Meeka Cartelli B, MD      . thiamine (VITAMIN B-1) tablet 100 mg  100 mg Oral Daily Clapacs, Jackquline Denmark, MD   100 mg at 10/09/16 0841  . traZODone (DESYREL) tablet 100 mg  100 mg Oral QHS PRN Clapacs, Jackquline Denmark, MD   100 mg at 10/08/16 2223  . white petrolatum (VASELINE) gel   Topical PRN Nabeel Gladson B, MD        Lab Results:  Results for orders placed or performed during the hospital encounter of 10/07/16 (from the past 48 hour(s))  Hemoglobin A1c     Status: None   Collection Time: 10/08/16  6:53 AM  Result Value Ref Range   Hgb A1c MFr Bld 5.4 4.8 - 5.6 %    Comment: (NOTE) Pre diabetes:          5.7%-6.4% Diabetes:              >6.4% Glycemic control for   <7.0% adults with diabetes    Mean Plasma Glucose 108.28 mg/dL    Comment: Performed at [facility], [address] [place], [place] [address]  Lipid panel     Status: Abnormal   Collection Time: 10/08/16  6:53 AM  Result Value Ref Range   Cholesterol 163 0 - 200 mg/dL   Triglycerides 130 (H) <150 mg/dL   HDL 41 >86 mg/dL   Total CHOL/HDL Ratio 4.0 RATIO   VLDL 45 (H) 0 - 40 mg/dL   LDL Cholesterol 77 0 - 99 mg/dL    Comment:        Total Cholesterol/HDL:CHD Risk Coronary Heart Disease Risk Table                      Men   Women  1/2 Average Risk   3.4   3.3  Average Risk       5.0   4.4  2 X Average Risk   9.6   7.1  3 X Average Risk  23.4   11.0  Use the calculated Patient Ratio above and the CHD Risk Table to determine the patient's CHD Risk.        ATP III CLASSIFICATION (LDL):  <100     mg/dL   Optimal  119-147  mg/dL   Near or Above                    Optimal  130-159  mg/dL   Borderline  829-562  mg/dL   High  >130     mg/dL   Very High   TSH     Status: Abnormal   Collection Time: 10/08/16  6:53 AM  Result Value Ref Range   TSH 5.034 (H) 0.350 - 4.500 uIU/mL    Comment: Performed by a 3rd Generation assay with a functional sensitivity of <=0.01 uIU/mL.  T4, free     Status: None   Collection Time: 10/08/16  6:53 AM  Result Value Ref Range   Free T4 0.91 0.61 - 1.12 ng/dL    Comment: (NOTE) Biotin ingestion may interfere with free T4 tests. If the results are inconsistent with the TSH level, previous test results, or the clinical presentation, then consider biotin interference. If needed, order repeat testing after stopping biotin.   Urine Drug Screen, Qualitative ([facility] only)     Status: Abnormal   Collection Time: 10/08/16  8:19 PM  Result Value Ref Range   Tricyclic, Ur Screen NONE DETECTED NONE DETECTED   Amphetamines, Ur Screen NONE DETECTED NONE DETECTED   MDMA (Ecstasy)Ur Screen NONE DETECTED NONE DETECTED   Cocaine Metabolite,Ur [place] NONE DETECTED NONE DETECTED   Opiate, Ur Screen NONE DETECTED NONE DETECTED   Phencyclidine (PCP) Ur S NONE DETECTED NONE DETECTED   Cannabinoid 50 Ng, Ur [place] NONE DETECTED NONE DETECTED   Barbiturates, Ur Screen NONE DETECTED NONE DETECTED   Benzodiazepine, Ur Scrn POSITIVE (A) NONE DETECTED   Methadone Scn, Ur NONE DETECTED NONE DETECTED    Comment: (NOTE) 100  Tricyclics, urine               Cutoff 1000 ng/mL 200  Amphetamines, urine             Cutoff 1000 ng/mL 300  MDMA (Ecstasy), urine           Cutoff 500 ng/mL 400  Cocaine  Metabolite, urine       Cutoff 300 ng/mL 500  Opiate, urine                   Cutoff 300 ng/mL 600  Phencyclidine (PCP), urine      Cutoff 25 ng/mL 700  Cannabinoid, urine              Cutoff 50 ng/mL 800  Barbiturates, urine             Cutoff 200 ng/mL 900  Benzodiazepine, urine           Cutoff 200 ng/mL 1000 Methadone, urine                Cutoff 300 ng/mL 1100 1200 The urine drug screen provides only a preliminary, unconfirmed 1300 analytical test result and should not be used for non-medical 1400 purposes. Clinical consideration and professional judgment should 1500 be applied to any positive drug screen result due to possible 1600 interfering substances. A more specific alternate chemical method 1700 must be used in order to obtain a confirmed analytical result.  1800 Gas chromato graphy / mass spectrometry (GC/MS) is the preferred  1900 confirmatory method.   Urinalysis, Complete w Microscopic     Status: Abnormal   Collection Time: 10/08/16  8:19 PM  Result Value Ref Range   Color, Urine STRAW (A) YELLOW   APPearance CLEAR (A) CLEAR   Specific Gravity, Urine 1.008 1.005 - 1.030   pH 7.0 5.0 - 8.0   Glucose, UA NEGATIVE NEGATIVE mg/dL   Hgb urine dipstick NEGATIVE NEGATIVE   Bilirubin Urine NEGATIVE NEGATIVE   Ketones, ur NEGATIVE NEGATIVE mg/dL   Protein, ur NEGATIVE NEGATIVE mg/dL   Nitrite NEGATIVE NEGATIVE   Leukocytes, UA NEGATIVE NEGATIVE   RBC / HPF NONE SEEN 0 - 5 RBC/hpf   WBC, UA 0-5 0 - 5 WBC/hpf   Bacteria, UA NONE SEEN NONE SEEN   Squamous Epithelial / LPF 0-5 (A) NONE SEEN    Blood Alcohol level:  Lab Results  Component Value Date   ETH 301 (HH) 10/07/2016    Metabolic Disorder Labs: Lab Results  Component Value Date   HGBA1C 5.4 10/08/2016   MPG 108.28 10/08/2016   No results found for: PROLACTIN Lab Results  Component Value Date   CHOL 163 10/08/2016   TRIG 227 (H) 10/08/2016   HDL 41 10/08/2016   CHOLHDL 4.0 10/08/2016   VLDL 45 (H)  10/08/2016   LDLCALC 77 10/08/2016    Physical Findings: AIMS: Facial and Oral Movements Muscles of Facial Expression: None, normal Lips and Perioral Area: None, normal Jaw: None, normal Tongue: None, normal,Extremity Movements Upper (arms, wrists, hands, fingers): None, normal Lower (legs, knees, ankles, toes): None, normal, Trunk Movements Neck, shoulders, hips: None, normal, Overall Severity Severity of abnormal movements (highest score from questions above): None, normal Incapacitation due to abnormal movements: None, normal Patient's awareness of abnormal movements (rate only patient's report): No Awareness, Dental Status Current problems with teeth and/or dentures?: No Does patient usually wear dentures?: No  CIWA:  CIWA-Ar Total: 1 COWS:  COWS Total Score: 3  Musculoskeletal: Strength & Muscle Tone: within normal limits Gait & Station: normal Patient leans: N/A  Psychiatric Specialty Exam: Physical Exam  Nursing note and vitals reviewed. Psychiatric: Her speech is normal and behavior is normal. Thought content normal. Cognition and memory are normal. She expresses impulsivity. She exhibits a depressed mood.    Review of Systems  Psychiatric/Behavioral: Positive for depression, substance abuse and suicidal ideas. The patient has insomnia.   All other systems reviewed and are negative.   Blood pressure 122/81, pulse 90, temperature 98.4 F (36.9 C), temperature source Oral, resp. rate 18, height 5\' 3"  (1.6 m), weight 69.4 kg (153 lb), last menstrual period 10/04/2016, SpO2 100 %.Body mass index is 27.1 kg/m.  General Appearance: Casual  Eye Contact:  Good  Speech:  Clear and Coherent  Volume:  Normal  Mood:  Depressed  Affect:  Blunt  Thought Process:  Goal Directed and Descriptions of Associations: Intact  Orientation:  Full (Time, Place, and Person)  Thought Content:  WDL  Suicidal Thoughts:  No  Homicidal Thoughts:  No  Memory:  Immediate;   Fair Recent;    Fair Remote;   Fair  Judgement:  Poor  Insight:  Shallow  Psychomotor Activity:  Normal  Concentration:  Concentration: Fair and Attention Span: Fair  Recall:  [employer] of Knowledge:  Fair  Language:  Fair  Akathisia:  No  Handed:  Right  AIMS (if indicated):     Assets:  Communication Skills Desire for Improvement Financial Resources/Insurance Housing Physical Health Resilience Social  Support  ADL's:  Intact  Cognition:  WNL  Sleep:  Number of Hours: 4.15     Treatment Plan Summary: Daily contact with patient to assess and evaluate symptoms and progress in treatment and Medication management   Tonya Huff is a 31 year old female with a history of bipolar disorder and substance abuse on Suboxone maintenance admitted for suicidal ideation in the context of major loss and relapse on drugs.  1. Suicidal ideation. The patient is able to contract for safety in the hospital.  2. Mood. She was restarted on Lithium and Neurontin for mood stabilization.   3. Opioid dependence. We continue Suboxone.  4. Alcohol abuse. The patient started drinking heavily following her husband's death 3 weeks ago. She was started on Librium but denies symptoms of alcohol withdrawal and complains of somnolence. Vital signs are stable. She asked to discontinue Librium.    5. Substance abuse. She is very much interested in SA IOP program at [facility] following discharge.  6. Smoking. Nicotine patch is available.  7. Insomnia. We will increase Trazodone.  8. Metabolic syndrome monitoring. Lipid panel and HgbA1C are normal.  9. Elevated TSH. Free T4 is normal.   10. Disposition. She will be discharged to home with a friend. She would like to attent her husband memorial service on Sunday. She has a follow up appointment with Dr. Marchia Bond at [facility] on Tuesday, 9/4, at 9:00 am.    Kristine Linea, MD 10/09/2016, 9:59 AM

## 2016-10-09 NOTE — [facility] Group Notes (Signed)
[facility] LCSW Group Therapy Note  Date/Time: 10/09/16, 0930  Type of Therapy and Topic:  Group Therapy:  Feelings around Relapse and Recovery  Participation Level:  Active   Mood: pleasant  Description of Group:    Patients in this group will discuss emotions they experience before and after a relapse. They will process how experiencing these feelings, or avoidance of experiencing them, relates to having a relapse. Facilitator will guide patients to explore emotions they have related to recovery. Patients will be encouraged to process which emotions are more powerful. They will be guided to discuss the emotional reaction significant others in their lives may have to patients' relapse or recovery. Patients will be assisted in exploring ways to respond to the emotions of others without this contributing to a relapse.  Therapeutic Goals: 1. Patient will identify two or more emotions that lead to relapse for them:  2. Patient will identify two emotions that result when they relapse:  3. Patient will identify two emotions related to recovery:  4. Patient will demonstrate ability to communicate their needs through discussion and/or role plays.   Summary of Patient Progress: Pt shared that feeling "lost" is an emotion that can lead to relapse for her.  Pt participated in the group discussion about the consequences of using substances to cope with negative feelings.  Pt shared that exercise and people watching with her brother are positive ways she has used to deal with difficult feelings.     Therapeutic Modalities:   Cognitive Behavioral Therapy Solution-Focused Therapy Assertiveness Training Relapse Prevention Therapy  Daleen SquibbGreg Jazyah Butsch, LCSW

## 2016-10-09 NOTE — Progress Notes (Signed)
At approximately 1930 security reported to this RN that the patient was observed taking something from her female visitor and placing it underneath her shirt. Other staff reported she put something in her mouth. Visitation concluded. UDS, room and skin search completed. No contraband found. The patient stated that it was a piece of gum. UDS positive for benzodiazepines. Ativan administered to patient while hospitalized. Patient reoriented to unit policies and procedures and verbalized understanding. Skin search witnessed by MHT Chelsea C.

## 2016-10-09 NOTE — [facility] Suicide Risk Assessment (Signed)
[facility]   Principal Problem: Bipolar I disorder, most recent episode depressed, severe without psychotic features [facility]([name]) Discharge Diagnoses:  Patient Active Problem List   Diagnosis Date Noted  . Tobacco use disorder [F17.200] 10/08/2016  . Opioid use disorder, mild, in early remission, on maintenance therapy, abuse [F11.11] 10/08/2016  . Alcohol use disorder, moderate, dependence ([name]) [F10.20] 10/07/2016  . Cocaine use disorder, moderate, dependence ([name]) [F14.20] 10/07/2016  . 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 Suicidal ideation [R45.851] 10/07/2016  . Bipolar I disorder, most recent episode depressed, severe without psychotic features ([name]) [F31.4] 10/07/2016    Total Time spent with patient: 30 minutes  Musculoskeletal: Strength & Muscle Tone: within normal limits Gait & Station: normal Patient leans: N/A  Psychiatric Specialty Exam: Review of Systems  Psychiatric/Behavioral: Positive for depression and substance abuse.  All other systems reviewed and are negative.   Blood pressure 122/81, pulse 90, temperature 98.4 F (36.9 C), temperature source Oral, resp. rate 18, height 5\' 3"  (1.6 m), weight 69.4 kg (153 lb), last menstrual period 10/04/2016, SpO2 100 %.Body mass index is 27.1 kg/m.  General Appearance: Casual  Eye Contact::  Good  Speech:  Clear and Coherent409  Volume:  Normal  Mood:  Anxious  Affect:  Appropriate  Thought Process:  Goal Directed and Descriptions of Associations: Intact  Orientation:  Full (Time, Place, and Person)  Thought Content:  WDL  Suicidal Thoughts:  No  Homicidal Thoughts:  No  Memory:  Immediate;   Fair Recent;   Fair Remote;   Fair  Judgement:  Impaired  Insight:  Present  Psychomotor Activity:  Normal  Concentration:  Fair  Recall:  [employer]  Fund of Knowledge:Fair  Language: Fair  Akathisia:  No  Handed:  Right  AIMS (if indicated):     Assets:  Communication Skills Desire for  Improvement Housing Physical Health Resilience Social Support  Sleep:  Number of Hours: 4.15  Cognition: WNL  ADL's:  Intact   Mental Status Per Nursing Assessment::   On Admission:     Demographic Factors:  Divorced or widowed, Caucasian, Low socioeconomic status and Unemployed  Loss Factors: Loss of significant relationship  Historical Factors: Prior suicide attempts, Family history of mental illness or substance abuse and Impulsivity  Risk Reduction Factors:   Sense of responsibility to family, Living with another person, especially a relative, Positive social support and Positive therapeutic relationship  Continued Clinical Symptoms:  Bipolar Disorder:   Depressive phase Depression:   Comorbid alcohol abuse/dependence Impulsivity Alcohol/Substance Abuse/Dependencies  Cognitive Features That Contribute To Risk:  None    Suicide Risk:  Minimal: No identifiable suicidal ideation.  Patients presenting with no risk factors but with morbid ruminations; may be classified as minimal risk based on the severity of the depressive symptoms  Follow-up Information    [facility].   Why:  Please attend your follow up appointment with Dr. Sheran FavaWeed on Tuesday, 10/13/16, at 9:00am.  Please bring a copy of your hospital discharge paperwork. Contact information: [address] [place] [address] [phone]           Plan Of Care/Follow-up recommendations:  Activity:  as tolerated. Diet:  regular. Other:  keep follow up appointments.  Tonya LineaJolanta Allien Melberg, MD 10/09/2016, 4:06 PM

## 2016-10-09 NOTE — Tx Team (Signed)
Interdisciplinary Treatment and Diagnostic Plan Update  10/09/2016 Time of Session: [name] MRN: [id number]  Principal Diagnosis: Bipolar I disorder, most recent episode depressed, severe without psychotic features ([name])  Secondary Diagnoses: Principal Problem:   Bipolar I disorder, most recent episode depressed, severe without psychotic features ([name]) Active Problems:   Alcohol use disorder, moderate, dependence ([name])   Cocaine use disorder, moderate, dependence ([name])   Suicidal ideation   Tobacco use disorder   Opioid use disorder, mild, in early remission, on maintenance therapy, abuse   Current Medications:  Current Facility-Administered Medications  Medication Dose Route Frequency Provider Last Rate Last Dose  . acetaminophen (TYLENOL) tablet 650 mg  650 mg Oral Q6H PRN Clapacs, Madie Reno, MD   650 mg at 10/09/16 5188  . alum & mag hydroxide-simeth (MAALOX/MYLANTA) 200-200-20 MG/5ML suspension 30 mL  30 mL Oral Q4H PRN Clapacs, Madie Reno, MD   30 mL at 10/08/16 2224  . buprenorphine-naloxone (SUBOXONE) 8-2 mg per SL tablet 1 tablet  1 tablet Sublingual BID AC Clapacs, Madie Reno, MD   1 tablet at 10/09/16 [phone]  . folic acid (FOLVITE) tablet 1 mg  1 mg Oral Daily Clapacs, Madie Reno, MD   1 mg at 10/09/16 [phone]  . gabapentin (NEURONTIN) capsule 900 mg  900 mg Oral TID Clapacs, Madie Reno, MD   900 mg at 10/09/16 1255  . lithium carbonate (LITHOBID) CR tablet 300 mg  300 mg Oral TID WC Clapacs, John T, MD   300 mg at 10/09/16 1255  . magnesium hydroxide (MILK OF MAGNESIA) suspension 30 mL  30 mL Oral Daily PRN Clapacs, John T, MD      . multivitamin with minerals tablet 1 tablet  1 tablet Oral Daily Clapacs, Madie Reno, MD   1 tablet at 10/09/16 [phone]  . nicotine (NICODERM CQ - dosed in mg/24 hours) patch 21 mg  21 mg Transdermal Daily Clapacs, Madie Reno, MD   21 mg at 10/09/16 0841  . pneumococcal 23 valent vaccine (PNU-IMMUNE) injection 0.5 mL  0.5 mL Intramuscular Tomorrow-1000 Pucilowska,  Jolanta B, MD      . thiamine (VITAMIN B-1) tablet 100 mg  100 mg Oral Daily Clapacs, Madie Reno, MD   100 mg at 10/09/16 0841  . traZODone (DESYREL) tablet 150 mg  150 mg Oral QHS Pucilowska, Jolanta B, MD      . white petrolatum (VASELINE) gel   Topical PRN Pucilowska, Jolanta B, MD       PTA Medications: Prescriptions Prior to Admission  Medication Sig Dispense Refill Last Dose  . buprenorphine (SUBUTEX) 2 MG SUBL SL tablet Place 4 mg under the tongue daily.   10/07/2016 at 1800  . gabapentin (NEURONTIN) 400 MG capsule Take 400 mg by mouth 3 (three) times daily.   10/07/2016 at 2200  . lithium carbonate 300 MG capsule Take 300 mg by mouth 3 (three) times daily with meals.   10/07/2016 at 2200    Patient Stressors: Loss of husband  Patient Strengths: Active sense of humor Capable of independent living Communication skills Supportive family/friends  Treatment Modalities: Medication Management, Group therapy, Case management,  1 to 1 session with clinician, Psychoeducation, Recreational therapy.   Physician Treatment Plan for Primary Diagnosis: Bipolar I disorder, most recent episode depressed, severe without psychotic features ([name]) Long Term Goal(s): Improvement in symptoms so as ready for discharge Improvement in symptoms so as ready for discharge   Short Term Goals: Ability to identify changes in lifestyle to reduce recurrence  of condition will improve Ability to verbalize feelings will improve Ability to disclose and discuss suicidal ideas Ability to demonstrate self-control will improve Ability to identify and develop effective coping behaviors will improve Ability to maintain clinical measurements within normal limits will improve Compliance with prescribed medications will improve Ability to identify triggers associated with substance abuse/mental health issues will improve Ability to identify changes in lifestyle to reduce recurrence of condition will improve Ability to  demonstrate self-control will improve Ability to identify triggers associated with substance abuse/mental health issues will improve  Medication Management: Evaluate patient's response, side effects, and tolerance of medication regimen.  Therapeutic Interventions: 1 to 1 sessions, Unit Group sessions and Medication administration.  Evaluation of Outcomes: Progressing  Physician Treatment Plan for Secondary Diagnosis: Principal Problem:   Bipolar I disorder, most recent episode depressed, severe without psychotic features ([name]) Active Problems:   Alcohol use disorder, moderate, dependence ([name])   Cocaine use disorder, moderate, dependence ([name])   Suicidal ideation   Tobacco use disorder   Opioid use disorder, mild, in early remission, on maintenance therapy, abuse  Long Term Goal(s): Improvement in symptoms so as ready for discharge Improvement in symptoms so as ready for discharge   Short Term Goals: Ability to identify changes in lifestyle to reduce recurrence of condition will improve Ability to verbalize feelings will improve Ability to disclose and discuss suicidal ideas Ability to demonstrate self-control will improve Ability to identify and develop effective coping behaviors will improve Ability to maintain clinical measurements within normal limits will improve Compliance with prescribed medications will improve Ability to identify triggers associated with substance abuse/mental health issues will improve Ability to identify changes in lifestyle to reduce recurrence of condition will improve Ability to demonstrate self-control will improve Ability to identify triggers associated with substance abuse/mental health issues will improve     Medication Management: Evaluate patient's response, side effects, and tolerance of medication regimen.  Therapeutic Interventions: 1 to 1 sessions, Unit Group sessions and Medication administration.  Evaluation of Outcomes:  Progressing   RN Treatment Plan for Primary Diagnosis: Bipolar I disorder, most recent episode depressed, severe without psychotic features ([name]) Long Term Goal(s): Knowledge of disease and therapeutic regimen to maintain health will improve  Short Term Goals: Ability to verbalize feelings will improve, Ability to disclose and discuss suicidal ideas, Ability to identify and develop effective coping behaviors will improve and Compliance with prescribed medications will improve  Medication Management: RN will administer medications as ordered by provider, will assess and evaluate patient's response and provide education to patient for prescribed medication. RN will report any adverse and/or side effects to prescribing provider.  Therapeutic Interventions: 1 on 1 counseling sessions, Psychoeducation, Medication administration, Evaluate responses to treatment, Monitor vital signs and CBGs as ordered, Perform/monitor CIWA, COWS, AIMS and Fall Risk screenings as ordered, Perform wound care treatments as ordered.  Evaluation of Outcomes: Progressing   LCSW Treatment Plan for Primary Diagnosis: Bipolar I disorder, most recent episode depressed, severe without psychotic features ([place]) Long Term Goal(s): Safe transition to appropriate next level of care at discharge, Engage patient in therapeutic group addressing interpersonal concerns.  Short Term Goals: Engage patient in aftercare planning with referrals and resources and Increase skills for wellness and recovery  Therapeutic Interventions: Assess for all discharge needs, 1 to 1 time with Social worker, Explore available resources and support systems, Assess for adequacy in community support network, Educate family and significant other(s) on suicide prevention, Complete Psychosocial Assessment, Interpersonal group therapy.  Evaluation  of Outcomes: Progressing   Progress in Treatment: Attending groups: Yes. Participating in groups: Yes. Taking  medication as prescribed: Yes. Toleration medication: Yes. Family/Significant other contact made: Yes, individual(s) contacted:  friend Patient understands diagnosis: Yes. Discussing patient identified problems/goals with staff: Yes. Medical problems stabilized or resolved: Yes. Denies suicidal/homicidal ideation: Yes. Issues/concerns per patient self-inventory: No. Other: none  New problem(s) identified: No, Describe:  none  New Short Term/Long Term Goal(s): Pt goal: "To clear my head a little bit."  Discharge Plan or Barriers: Pt will follow up with [facility].  Reason for Continuation of Hospitalization: Depression Medication stabilization  Estimated Length of Stay: 1-2 days.  Attendees: Patient: Tonya Huff 10/09/2016   Physician: Dr. Bary Leriche, MD 10/09/2016   Nursing: Tai Doyle Askew 10/09/2016   RN Care Manager: 10/09/2016   Social Worker: Lurline Idol, LCSW 10/09/2016   Recreational Therapist: Everitt Amber, LRT/CTRS  10/09/2016   Other:  10/09/2016   Other:  10/09/2016   Other: 10/09/2016     Scribe for Treatment Team: Joanne Chars, [place] 10/09/2016 2:22 PM

## 2016-10-09 NOTE — Plan of Care (Signed)
Problem: Activity: Goal: Sleeping patterns will improve Outcome: Progressing Per patient report she has difficultly falling asleep and disturbances throughout the night. 100mg  Trazodone administered with positive effects. Patient currently in bed with eyes closed and appears to be resting with no disturbances. Will continue to monitor.   Problem: Education: Goal: Mental status will improve Patient A&Ox 4. Reports good mood. Visible congruent affect.   Problem: Self-Concept: Goal: Level of anxiety will decrease Outcome: Progressing No anxiety reported or noted up to the entry of this note.

## 2016-10-09 NOTE — Progress Notes (Signed)
  [facility] Discharge [facility] :  Will you be returning to the same living situation after discharge:  Yes,  with friend At discharge, do you have transportation home?: Yes,  friend Do you have the ability to pay for your medications: Yes,  medicaid  Release of information consent forms completed and in the chart;  Patient's signature needed at discharge.  Patient to Follow up at: Follow-up Information    [facility] Go on 10/13/2016.   Why:  Please attend your follow up appointment with Dr. Sheran FavaWeed on Tuesday, 10/13/16, at 9:00am.  Please bring a copy of your hospital discharge paperwork. Contact information: [address] [place] [id number] [phone]           Next level of care provider [facility] Link:no  Safety Planning and Suicide Prevention discussed: Yes,  with friend  Have you used any form of tobacco in the last 30 days? (Cigarettes, Smokeless Tobacco, Cigars, and/or Pipes): Yes  Has patient been referred to the Quitline?: Patient refused referral  Patient has been referred for addiction treatment: Yes  Lorri FrederickWierda, Vahan Wadsworth Jon, LCSW 10/09/2016, 4:01 PM
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
## 2016-10-09 NOTE — Progress Notes (Signed)
Patient denies SI/HI, denies A/V hallucinations. Patient verbalizes understanding of discharge instructions, follow up care and prescriptions. Patient given all belongings from  locker. Patient escorted out by staff, transported by friend.

## 2016-10-30 ENCOUNTER — Emergency Department: Payer: Medicaid Other

## 2016-10-30 ENCOUNTER — Inpatient Hospital Stay
Admit: 2016-10-30 | Discharge: 2016-10-30 | Disposition: A | Discharge status: Home / Self Care | Payer: Medicaid Other | Attending: Internal Medicine | Admitting: Internal Medicine

## 2016-10-30 ENCOUNTER — Inpatient Hospital Stay
Admission: EM | Admit: 2016-10-30 | Discharge: 2016-10-31 | DRG: 897 | Disposition: A | Discharge status: Home / Self Care | Payer: Medicaid Other | Attending: Internal Medicine | Admitting: Internal Medicine

## 2016-10-30 DIAGNOSIS — R7989 Other specified abnormal findings of blood chemistry: Secondary | ICD-10-CM

## 2016-10-30 DIAGNOSIS — F315 Bipolar disorder, current episode depressed, severe, with psychotic features: Secondary | ICD-10-CM | POA: Diagnosis present

## 2016-10-30 DIAGNOSIS — F17210 Nicotine dependence, cigarettes, uncomplicated: Secondary | ICD-10-CM | POA: Diagnosis present

## 2016-10-30 DIAGNOSIS — Z888 Allergy status to other drugs, medicaments and biological substances status: Secondary | ICD-10-CM | POA: Diagnosis not present

## 2016-10-30 DIAGNOSIS — F1210 Cannabis abuse, uncomplicated: Secondary | ICD-10-CM | POA: Diagnosis present

## 2016-10-30 DIAGNOSIS — A419 Sepsis, unspecified organism: Secondary | ICD-10-CM | POA: Diagnosis present

## 2016-10-30 DIAGNOSIS — Y906 Blood alcohol level of 120-199 mg/100 ml: Secondary | ICD-10-CM | POA: Diagnosis present

## 2016-10-30 DIAGNOSIS — F10239 Alcohol dependence with withdrawal, unspecified: Secondary | ICD-10-CM | POA: Diagnosis present

## 2016-10-30 DIAGNOSIS — F314 Bipolar disorder, current episode depressed, severe, without psychotic features: Secondary | ICD-10-CM | POA: Diagnosis not present

## 2016-10-30 DIAGNOSIS — I959 Hypotension, unspecified: Secondary | ICD-10-CM | POA: Diagnosis present

## 2016-10-30 DIAGNOSIS — G8929 Other chronic pain: Secondary | ICD-10-CM | POA: Diagnosis present

## 2016-10-30 DIAGNOSIS — F1111 Opioid abuse, in remission: Secondary | ICD-10-CM | POA: Diagnosis present

## 2016-10-30 DIAGNOSIS — R1011 Right upper quadrant pain: Secondary | ICD-10-CM

## 2016-10-30 DIAGNOSIS — E860 Dehydration: Secondary | ICD-10-CM | POA: Diagnosis present

## 2016-10-30 DIAGNOSIS — F1423 Cocaine dependence with withdrawal: Secondary | ICD-10-CM | POA: Diagnosis present

## 2016-10-30 DIAGNOSIS — F1020 Alcohol dependence, uncomplicated: Secondary | ICD-10-CM | POA: Diagnosis present

## 2016-10-30 DIAGNOSIS — F1420 Cocaine dependence, uncomplicated: Secondary | ICD-10-CM | POA: Diagnosis present

## 2016-10-30 DIAGNOSIS — F1410 Cocaine abuse, uncomplicated: Secondary | ICD-10-CM

## 2016-10-30 DIAGNOSIS — D72829 Elevated white blood cell count, unspecified: Secondary | ICD-10-CM

## 2016-10-30 LAB — CBC
HCT: 39.8 % (ref 35.0–47.0)
Hemoglobin: 14.1 g/dL (ref 12.0–16.0)
MCH: 33.8 pg (ref 26.0–34.0)
MCHC: 35.5 g/dL (ref 32.0–36.0)
MCV: 95.3 fL (ref 80.0–100.0)
PLATELETS: 295 10*3/uL (ref 150–440)
RBC: 4.17 MIL/uL (ref 3.80–5.20)
RDW: 14.2 % (ref 11.5–14.5)
WBC: 28.2 10*3/uL — ABNORMAL HIGH (ref 3.6–11.0)

## 2016-10-30 LAB — URINE DRUG SCREEN, QUALITATIVE (ARMC ONLY)
Amphetamines, Ur Screen: NOT DETECTED
Barbiturates, Ur Screen: NOT DETECTED
Benzodiazepine, Ur Scrn: NOT DETECTED
CANNABINOID 50 NG, UR ~~LOC~~: POSITIVE — AB
COCAINE METABOLITE, UR ~~LOC~~: POSITIVE — AB
MDMA (ECSTASY) UR SCREEN: NOT DETECTED
Methadone Scn, Ur: NOT DETECTED
OPIATE, UR SCREEN: POSITIVE — AB
Phencyclidine (PCP) Ur S: NOT DETECTED
TRICYCLIC, UR SCREEN: NOT DETECTED

## 2016-10-30 LAB — URINALYSIS, COMPLETE (UACMP) WITH MICROSCOPIC
BILIRUBIN URINE: NEGATIVE
Bacteria, UA: NONE SEEN
GLUCOSE, UA: 50 mg/dL — AB
Ketones, ur: NEGATIVE mg/dL
NITRITE: NEGATIVE
PH: 5 (ref 5.0–8.0)
Protein, ur: NEGATIVE mg/dL
SPECIFIC GRAVITY, URINE: 1.015 (ref 1.005–1.030)

## 2016-10-30 LAB — COMPREHENSIVE METABOLIC PANEL
ALT: 18 U/L (ref 14–54)
AST: 28 U/L (ref 15–41)
Albumin: 4 g/dL (ref 3.5–5.0)
Alkaline Phosphatase: 80 U/L (ref 38–126)
Anion gap: 14 (ref 5–15)
BILIRUBIN TOTAL: 0.6 mg/dL (ref 0.3–1.2)
BUN: 9 mg/dL (ref 6–20)
CALCIUM: 8.5 mg/dL — AB (ref 8.9–10.3)
CO2: 22 mmol/L (ref 22–32)
CREATININE: 0.58 mg/dL (ref 0.44–1.00)
Chloride: 102 mmol/L (ref 101–111)
Glucose, Bld: 155 mg/dL — ABNORMAL HIGH (ref 65–99)
Potassium: 3.5 mmol/L (ref 3.5–5.1)
Sodium: 138 mmol/L (ref 135–145)
TOTAL PROTEIN: 7.4 g/dL (ref 6.5–8.1)

## 2016-10-30 LAB — ACETAMINOPHEN LEVEL

## 2016-10-30 LAB — ETHANOL: Alcohol, Ethyl (B): 178 mg/dL — ABNORMAL HIGH (ref <5)

## 2016-10-30 LAB — SALICYLATE LEVEL

## 2016-10-30 LAB — LACTIC ACID, PLASMA
LACTIC ACID, VENOUS: 2.3 mmol/L — AB (ref 0.5–1.9)
Lactic Acid, Venous: 3.2 mmol/L (ref 0.5–1.9)

## 2016-10-30 LAB — LIPASE, BLOOD: LIPASE: 16 U/L (ref 11–51)

## 2016-10-30 LAB — POC URINE PREG, ED: Preg Test, Ur: NEGATIVE

## 2016-10-30 LAB — LITHIUM LEVEL

## 2016-10-30 MED ORDER — FENTANYL CITRATE (PF) 100 MCG/2ML IJ SOLN
50.0000 ug | Freq: Once | INTRAMUSCULAR | Status: AC
  Administered 2016-10-30: 50 ug via INTRAVENOUS
  Filled 2016-10-30: qty 2

## 2016-10-30 MED ORDER — PIPERACILLIN-TAZOBACTAM 3.375 G IVPB
3.3750 g | Freq: Three times a day (TID) | INTRAVENOUS | Status: DC
  Administered 2016-10-30 – 2016-10-31 (×3): 3.375 g via INTRAVENOUS
  Filled 2016-10-30 (×3): qty 50

## 2016-10-30 MED ORDER — ADULT MULTIVITAMIN W/MINERALS CH
1.0000 | ORAL_TABLET | Freq: Every day | ORAL | Status: DC
  Administered 2016-10-30 – 2016-10-31 (×2): 1 via ORAL
  Filled 2016-10-30 (×2): qty 1

## 2016-10-30 MED ORDER — SODIUM CHLORIDE 0.9 % IV SOLN
INTRAVENOUS | Status: DC
  Administered 2016-10-30: 16:00:00 via INTRAVENOUS

## 2016-10-30 MED ORDER — PIPERACILLIN-TAZOBACTAM 3.375 G IVPB 30 MIN
INTRAVENOUS | Status: AC
  Administered 2016-10-30: 3.375 g via INTRAVENOUS
  Filled 2016-10-30: qty 50

## 2016-10-30 MED ORDER — ENOXAPARIN SODIUM 40 MG/0.4ML ~~LOC~~ SOLN
40.0000 mg | SUBCUTANEOUS | Status: DC
  Administered 2016-10-30: 40 mg via SUBCUTANEOUS
  Filled 2016-10-30: qty 0.4

## 2016-10-30 MED ORDER — VANCOMYCIN HCL IN DEXTROSE 1-5 GM/200ML-% IV SOLN
1000.0000 mg | Freq: Two times a day (BID) | INTRAVENOUS | Status: DC
  Administered 2016-10-30 – 2016-10-31 (×2): 1000 mg via INTRAVENOUS
  Filled 2016-10-30 (×3): qty 200

## 2016-10-30 MED ORDER — HYDROMORPHONE HCL 1 MG/ML IJ SOLN
1.0000 mg | Freq: Once | INTRAMUSCULAR | Status: AC
  Administered 2016-10-30: 1 mg via INTRAVENOUS
  Filled 2016-10-30: qty 1

## 2016-10-30 MED ORDER — MORPHINE SULFATE (PF) 4 MG/ML IV SOLN
INTRAVENOUS | Status: AC
  Administered 2016-10-30: 4 mg via INTRAVENOUS
  Filled 2016-10-30: qty 1

## 2016-10-30 MED ORDER — LORAZEPAM 2 MG/ML IJ SOLN
INTRAMUSCULAR | Status: AC
  Administered 2016-10-30: 0.5 mg via INTRAVENOUS
  Filled 2016-10-30: qty 1

## 2016-10-30 MED ORDER — PIPERACILLIN-TAZOBACTAM 3.375 G IVPB 30 MIN
3.3750 g | Freq: Once | INTRAVENOUS | Status: AC
  Administered 2016-10-30: 3.375 g via INTRAVENOUS

## 2016-10-30 MED ORDER — LITHIUM CARBONATE 300 MG PO CAPS
300.0000 mg | ORAL_CAPSULE | Freq: Three times a day (TID) | ORAL | Status: DC
  Administered 2016-10-30 – 2016-10-31 (×2): 300 mg via ORAL
  Filled 2016-10-30 (×4): qty 1

## 2016-10-30 MED ORDER — LORAZEPAM 1 MG PO TABS
1.0000 mg | ORAL_TABLET | Freq: Four times a day (QID) | ORAL | Status: DC | PRN
  Administered 2016-10-30 – 2016-10-31 (×2): 1 mg via ORAL
  Filled 2016-10-30 (×3): qty 1

## 2016-10-30 MED ORDER — MORPHINE SULFATE (PF) 4 MG/ML IV SOLN
4.0000 mg | Freq: Once | INTRAVENOUS | Status: AC
  Administered 2016-10-30: 4 mg via INTRAVENOUS

## 2016-10-30 MED ORDER — MORPHINE SULFATE (PF) 2 MG/ML IV SOLN
2.0000 mg | Freq: Four times a day (QID) | INTRAVENOUS | Status: DC | PRN
  Administered 2016-10-30 – 2016-10-31 (×4): 2 mg via INTRAVENOUS
  Filled 2016-10-30 (×3): qty 1

## 2016-10-30 MED ORDER — HYDROMORPHONE HCL 1 MG/ML IJ SOLN
1.0000 mg | Freq: Once | INTRAMUSCULAR | Status: AC
  Administered 2016-10-30: 1 mg via INTRAVENOUS

## 2016-10-30 MED ORDER — ONDANSETRON HCL 4 MG/2ML IJ SOLN
INTRAMUSCULAR | Status: AC
  Administered 2016-10-30: 4 mg via INTRAVENOUS
  Filled 2016-10-30: qty 2

## 2016-10-30 MED ORDER — GABAPENTIN 300 MG PO CAPS
900.0000 mg | ORAL_CAPSULE | Freq: Three times a day (TID) | ORAL | Status: DC
  Administered 2016-10-30 – 2016-10-31 (×2): 900 mg via ORAL
  Filled 2016-10-30 (×2): qty 3

## 2016-10-30 MED ORDER — FOLIC ACID 1 MG PO TABS
1.0000 mg | ORAL_TABLET | Freq: Every day | ORAL | Status: DC
  Administered 2016-10-30 – 2016-10-31 (×2): 1 mg via ORAL
  Filled 2016-10-30 (×2): qty 1

## 2016-10-30 MED ORDER — ONDANSETRON HCL 4 MG/2ML IJ SOLN
4.0000 mg | Freq: Once | INTRAMUSCULAR | Status: AC
  Administered 2016-10-30: 4 mg via INTRAVENOUS

## 2016-10-30 MED ORDER — VITAMIN B-1 100 MG PO TABS
100.0000 mg | ORAL_TABLET | Freq: Every day | ORAL | Status: DC
  Administered 2016-10-30 – 2016-10-31 (×2): 100 mg via ORAL
  Filled 2016-10-30 (×2): qty 1

## 2016-10-30 MED ORDER — LORAZEPAM 2 MG/ML IJ SOLN
1.0000 mg | Freq: Once | INTRAMUSCULAR | Status: AC
Start: 2016-10-30 — End: 2016-10-30
  Administered 2016-10-30: 1 mg via INTRAVENOUS
  Filled 2016-10-30: qty 1

## 2016-10-30 MED ORDER — KETOROLAC TROMETHAMINE 15 MG/ML IJ SOLN
15.0000 mg | Freq: Four times a day (QID) | INTRAMUSCULAR | Status: DC | PRN
  Administered 2016-10-30 – 2016-10-31 (×3): 15 mg via INTRAVENOUS
  Filled 2016-10-30 (×3): qty 1

## 2016-10-30 MED ORDER — IOPAMIDOL (ISOVUE-300) INJECTION 61%
100.0000 mL | Freq: Once | INTRAVENOUS | Status: AC | PRN
  Administered 2016-10-30: 100 mL via INTRAVENOUS

## 2016-10-30 MED ORDER — NICOTINE POLACRILEX 2 MG MT GUM
2.0000 mg | CHEWING_GUM | OROMUCOSAL | Status: DC | PRN
  Administered 2016-10-30 – 2016-10-31 (×5): 2 mg via ORAL
  Filled 2016-10-30 (×6): qty 1

## 2016-10-30 MED ORDER — SODIUM CHLORIDE 0.9 % IV BOLUS (SEPSIS)
1000.0000 mL | Freq: Once | INTRAVENOUS | Status: AC
  Administered 2016-10-30: 1000 mL via INTRAVENOUS

## 2016-10-30 MED ORDER — TRAZODONE HCL 50 MG PO TABS
150.0000 mg | ORAL_TABLET | Freq: Every day | ORAL | Status: DC
  Administered 2016-10-30 (×2): 150 mg via ORAL
  Filled 2016-10-30: qty 1

## 2016-10-30 MED ORDER — LORAZEPAM 2 MG/ML IJ SOLN
0.5000 mg | Freq: Once | INTRAMUSCULAR | Status: AC
  Administered 2016-10-30: 0.5 mg via INTRAVENOUS

## 2016-10-30 MED ORDER — BUPRENORPHINE HCL-NALOXONE HCL 2-0.5 MG SL SUBL
2.0000 | SUBLINGUAL_TABLET | Freq: Every day | SUBLINGUAL | Status: DC
  Administered 2016-10-30 – 2016-10-31 (×2): 2 via SUBLINGUAL
  Filled 2016-10-30 (×3): qty 2

## 2016-10-30 MED ORDER — LORAZEPAM 2 MG/ML IJ SOLN: 1.0000 mg | Freq: Four times a day (QID) | INTRAMUSCULAR | Status: DC | PRN

## 2016-10-30 MED ORDER — THIAMINE HCL 100 MG/ML IJ SOLN: 100.0000 mg | Freq: Every day | INTRAMUSCULAR | Status: DC

## 2016-10-30 MED ORDER — HYDROMORPHONE HCL 1 MG/ML IJ SOLN
INTRAMUSCULAR | Status: AC
  Administered 2016-10-30: 1 mg via INTRAVENOUS
  Filled 2016-10-30: qty 1

## 2016-10-30 MED ORDER — PANTOPRAZOLE SODIUM 40 MG IV SOLR
40.0000 mg | Freq: Every day | INTRAVENOUS | Status: DC
  Administered 2016-10-30 – 2016-10-31 (×2): 40 mg via INTRAVENOUS
  Filled 2016-10-30 (×2): qty 40

## 2016-10-30 MED ORDER — DEXTROSE-NACL 5-0.9 % IV SOLN: 1000.0000 mL | Freq: Once | INTRAVENOUS | Status: DC

## 2016-10-30 MED ORDER — MORPHINE SULFATE (PF) 2 MG/ML IV SOLN
INTRAVENOUS | Status: AC
  Administered 2016-10-30: 2 mg via INTRAVENOUS
  Filled 2016-10-30: qty 1

## 2016-10-30 MED ORDER — ONDANSETRON HCL 4 MG PO TABS
4.0000 mg | ORAL_TABLET | Freq: Four times a day (QID) | ORAL | Status: DC | PRN
  Administered 2016-10-31: 4 mg via ORAL
  Filled 2016-10-30: qty 1

## 2016-10-30 MED ORDER — VANCOMYCIN HCL IN DEXTROSE 1-5 GM/200ML-% IV SOLN
1000.0000 mg | Freq: Once | INTRAVENOUS | Status: AC
  Administered 2016-10-30: 1000 mg via INTRAVENOUS
  Filled 2016-10-30: qty 200

## 2016-10-30 MED ORDER — ONDANSETRON HCL 4 MG/2ML IJ SOLN
4.0000 mg | Freq: Once | INTRAMUSCULAR | Status: AC
  Administered 2016-10-30: 4 mg via INTRAVENOUS
  Filled 2016-10-30: qty 2

## 2016-10-30 MED ORDER — ONDANSETRON HCL 4 MG/2ML IJ SOLN: 4.0000 mg | Freq: Four times a day (QID) | INTRAMUSCULAR | Status: DC | PRN

## 2016-10-30 MED ORDER — IOPAMIDOL (ISOVUE-300) INJECTION 61%
30.0000 mL | Freq: Once | INTRAVENOUS | Status: AC | PRN
  Administered 2016-10-30: 30 mL via ORAL

## 2016-10-30 NOTE — ED Provider Notes (Signed)
[facility] Emergency Department Provider Note   ____________________________________________   First MD Initiated Contact with Patient 10/30/16 [phone]     (approximate)  I have reviewed the triage vital signs and the nursing notes.   HISTORY  Chief Complaint Abdominal Pain    HPI Raguel LYNN Marini is a 31 y.o. female brought to the ED from home via EMS with a chief complaint of abdominal pain. Patient reports sudden onset of right upper quadrant abdominal pain which awoke her from sleep 2 hours ago. Denies associated fever, chills, chest pain, shortness of breath, nausea, vomiting, dysuria, diarrhea.Patient is an admitted IV drug user; admits to cocaine use yesterday. Recent inpatient psychiatric hospitalization after husband committed suicide by cop. Denies recent travel or trauma.  Past Medical History:  Diagnosis Date  . Anxiety   . Bipolar 1 disorder [facility])     Patient Active Problem List   Diagnosis Date Noted  . Tobacco use disorder 10/08/2016  . Opioid use disorder, mild, in early remission, on maintenance therapy, abuse 10/08/2016  . Alcohol use disorder, moderate, dependence ([name]) 10/07/2016  . Cocaine use disorder, moderate, dependence ([name]) 10/07/2016  . 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 Suicidal ideation 10/07/2016  . Bipolar I disorder, most recent episode depressed, severe without psychotic features ([name]) 10/07/2016    History reviewed. No pertinent surgical history.  Prior to Admission medications   Medication Sig Start Date End Date Taking? Authorizing Provider  buprenorphine (SUBUTEX) 2 MG SUBL SL tablet Place 4 mg under the tongue daily.    [provider]  gabapentin (NEURONTIN) 300 MG capsule Take 3 capsules (900 mg total) by mouth 3 (three) times daily. 10/09/16   Pucilowska, Braulio Conte B, MD  lithium carbonate 300 MG capsule Take 1 capsule (300 mg total) by mouth 3 (three) times daily with meals. 10/09/16   Pucilowska,  Braulio Conte B, MD  traZODone (DESYREL) 150 MG tablet Take 1 tablet (150 mg total) by mouth at bedtime. 10/09/16   Pucilowska, Ellin Goodie, MD    Allergies Haldol [haloperidol]  No family history on file.  Social History Social History  Substance Use Topics  . Smoking status: Current Every Day Smoker    Packs/day: 2.00    Years: 10.00    Types: Cigarettes  . Smokeless tobacco: Never Used     Comment: Pt would like the nicotine gum  . Alcohol use 6.0 oz/week    10 Shots of liquor per week    Review of Systems  Constitutional: No fever/chills. Eyes: No visual changes. ENT: No sore throat. Cardiovascular: Denies chest pain. Respiratory: Denies shortness of breath. Gastrointestinal: positive for abdominal pain.  No nausea, no vomiting.  No diarrhea.  No constipation. Genitourinary: Negative for dysuria. Musculoskeletal: Negative for back pain. Skin: Negative for rash. Neurological: Negative for headaches, focal weakness or numbness.   ____________________________________________   PHYSICAL EXAM:  VITAL SIGNS: ED Triage Vitals  Enc Vitals Group     BP 10/30/16 0422 108/65     Pulse Rate 10/30/16 0422 63     Resp 10/30/16 0422 20     Temp 10/30/16 0422 98.9 F (37.2 C)     Temp Source 10/30/16 0422 Oral     SpO2 10/30/16 0422 100 %     Weight 10/30/16 0423 153 lb (69.4 kg)     Height 10/30/16 0423  (1.575 m)     Head Circumference --      Peak Flow --      Pain  Score 10/30/16 0422 10     Pain Loc --      Pain Edu? --      Excl. in GC? --     Constitutional: Alert and oriented. Well appearing and in moderate acute distress. Eyes: Conjunctivae are normal. PERRL. EOMI. Head: Atraumatic. Nose: No congestion/rhinnorhea. Mouth/Throat: Mucous membranes are moist.  Oropharynx non-erythematous. Neck: No stridor.   Cardiovascular: Normal rate, regular rhythm. Grossly normal heart sounds.  Good peripheral circulation. Respiratory: Normal respiratory effort.  No  retractions. Lungs CTAB. Gastrointestinal: Difficult exam because patient will not let me examine her abdomen; Soft and tender to palpation right side. No distention. No abdominal bruits. No CVA tenderness. Musculoskeletal: No lower extremity tenderness nor edema.  No joint effusions. Neurologic:  Normal speech and language. No gross focal neurologic deficits are appreciated.  Skin:  Skin is warm, dry and intact. No rash noted. Psychiatric: Mood and affect are tearful. Speech and behavior are normal.  ____________________________________________   LABS (all labs ordered are listed, but only abnormal results are displayed)  Labs Reviewed  COMPREHENSIVE METABOLIC PANEL - Abnormal; Notable for the following:       Result Value   Glucose, Bld 155 (*)    Calcium 8.5 (*)    All other components within normal limits  CBC - Abnormal; Notable for the following:    WBC 28.2 (*)    All other components within normal limits  URINALYSIS, COMPLETE (UACMP) WITH MICROSCOPIC - Abnormal; Notable for the following:    Color, Urine YELLOW (*)    APPearance CLEAR (*)    Glucose, UA 50 (*)    Hgb urine dipstick MODERATE (*)    Leukocytes, UA TRACE (*)    Squamous Epithelial / LPF 0-5 (*)    All other components within normal limits  URINE DRUG SCREEN, QUALITATIVE ([facility] ONLY) - Abnormal; Notable for the following:    Cocaine Metabolite,Ur [place] POSITIVE (*)    Opiate, Ur Screen POSITIVE (*)    Cannabinoid 50 Ng, Ur [place] POSITIVE (*)    All other components within normal limits  LACTIC ACID, PLASMA - Abnormal; Notable for the following:    Lactic Acid, Venous 3.2 (*)    All other components within normal limits  LITHIUM LEVEL - Abnormal; Notable for the following:    Lithium Lvl <0.06 (*)    All other components within normal limits  ETHANOL - Abnormal; Notable for the following:    Alcohol, Ethyl (B) 178 (*)    All other components within normal limits  ACETAMINOPHEN LEVEL - Abnormal; Notable for the  following:    Acetaminophen (Tylenol), Serum <10 (*)    All other components within normal limits  LIPASE, BLOOD  SALICYLATE LEVEL  LACTIC ACID, PLASMA  POC URINE PREG, ED   ____________________________________________  EKG  none ____________________________________________  RADIOLOGY  US Abdomen Limited Ruq  Result Date: 10/30/2016 CLINICAL DATA:  Right upper quadrant pain for several hours. EXAM: ULTRASOUND ABDOMEN LIMITED RIGHT UPPER QUADRANT COMPARISON:  None. FINDINGS: Gallbladder: No gallstones or wall thickening visualized. No sonographic Murphy sign noted by sonographer. Common bile duct: Diameter: 4.2 mm, normal Liver: No focal lesion identified. Within normal limits in parenchymal echogenicity. Portal vein is patent on color Doppler imaging with normal direction of blood flow towards the liver. IMPRESSION: Normal examination. Electronically Signed   By: Burman Nieves M.D.   On: 10/30/2016 06:26    ____________________________________________   PROCEDURES  Procedure(s) performed: None  Procedures  Critical Care performed:  Yes, see critical care note(s)   CRITICAL CARE Performed by: Irean Hong   Total critical care time: 30 minutes  Critical care time was exclusive of separately billable procedures and treating other patients.  Critical care was necessary to treat or prevent imminent or life-threatening deterioration.  Critical care was time spent personally by me on the following activities: development of treatment plan with patient and/or surrogate as well as nursing, discussions with consultants, evaluation of patient's response to treatment, examination of patient, obtaining history from patient or surrogate, ordering and performing treatments and interventions, ordering and review of laboratory studies, ordering and review of radiographic studies, pulse oximetry and re-evaluation of patient's  condition.  ____________________________________________   INITIAL IMPRESSION / ASSESSMENT AND PLAN / ED COURSE  Pertinent labs & imaging results that were available during my care of the patient were reviewed by me and considered in my medical decision making (see chart for details).  31 year old female with a history of IVDA presenting with right-sided abdominal pain. Differential diagnosis includes cholelithiasis, appendicitis, colitis, renal colic, ischemia secondary to cocaine use, opioid-induced constipation. Difficult exam secondary to lack of patient cooperation. Will obtain screening lab work including lactate, urinalysis and start with right upper quadrant abdominal ultrasound to evaluate for cholecystitis.  Clinical Course as of Oct 31 710  Fri Oct 30, 2016  1610 Patient currently in ultrasound. Noted leukocytosis and elevated lactate. Once patient returns from ultrasound, will initiate IV antibiotics for intra-abdominal infection. Most likely patient will require CT scan to evaluate intra-abdominal etiology if ultrasound is unremarkable.  [JS]  [id number] Patient returned from ultrasound; complaining of pain. Will administer morphine.  [JS]  0706 Patient cursing, no relief with morphine or Ativan. Dilaudid ordered. Encouraged patient to drink oral contrast if she can. Care transferred to Dr. Scotty Court. If patient is unable to tolerate oral contrast, she should be scanned without oral contrast to evaluate intra-abdominal etiology of her pain. Anticipate hospital admission; medicine versus surgery.  [JS]    Clinical Course User Index [JS] Irean Hong, MD     ____________________________________________   FINAL CLINICAL IMPRESSION(S) / ED DIAGNOSES  Final diagnoses:  Right upper quadrant abdominal pain  Leukocytosis, unspecified type  Elevated lactic acid level  Cocaine abuse  Marijuana abuse      NEW MEDICATIONS STARTED DURING THIS VISIT:  New Prescriptions   No  medications on file     Note:  This document was prepared using Dragon voice recognition software and may include unintentional dictation errors.    Irean Hong, MD 10/30/16 [phone]

## 2016-10-30 NOTE — ED Notes (Signed)
Date and time results received: 10/30/16 0540  Test: Lactic Acid  Critical Value: 3.2  Name of Provider Notified: MD Dolores Frame

## 2016-10-30 NOTE — ED Notes (Signed)
Patient refusing CT.

## 2016-10-30 NOTE — Progress Notes (Signed)
Pharmacy Antibiotic Note  Tonya Huff is a 31 y.o. female admitted on 10/30/2016 with abdominal pain and  sepsis.  Pharmacy has been consulted for Zosyn and Vancomycin dosing. Patient received Zosyn 3.375 IV x 1 and vancomycin 1g IV x 1 dose in ED.   Plan: Ke: 0.087   Vd: 48   T1/2: 8  Will start patient on vancomycin 1g IV every 12 hours with 6 hour stack dosing. Calculated trough at Css is 15. Trough ordered prior to 4th dose.   Start Zosyn 3.375 IV EI every 8 hours.   Height:  (157.5 cm) Weight: 153 lb (69.4 kg) IBW/kg (Calculated) : 50.1  Temp (24hrs), Avg:99.5 F (37.5 C), Min:98.9 F (37.2 C), Max:100.2 F (37.9 C)   Recent Labs Lab 10/30/16 0424 10/30/16 0449 10/30/16 0807  WBC 28.2*  --   --   CREATININE 0.58  --   --   LATICACIDVEN  --  3.2* 2.3*    Estimated Creatinine Clearance: 91.3 mL/min (by C-G formula based on SCr of 0.58 mg/dL).    Allergies  Allergen Reactions  . Haldol [Haloperidol]     Antimicrobials this admission: 9/21 Zosyn >>  9/21 vancomycin >>   Dose adjustments this admission:  Microbiology results: 9/21 BCx: pending 9/21 UCx: sent   Thank you for allowing pharmacy to be a part of this patient's care.  Gardner Candle, PharmD, BCPS Clinical Pharmacist 10/30/2016 2:48 PM

## 2016-10-30 NOTE — H&P (Signed)
[facility] - [place] at [facility]   PATIENT NAME: Tonya Huff    MR#:  [id number]  DATE OF BIRTH:  04/07/1985  DATE OF ADMISSION:  10/30/2016  PRIMARY CARE PHYSICIAN: Patient, No Pcp Per   REQUESTING/REFERRING PHYSICIAN: STAFFORD  CHIEF COMPLAINT:  Abdominal pain and nausea  HISTORY OF PRESENT ILLNESS:  Tonya Huff  is a 31 y.o. female with a known history of Bipolar disorder, drug abuse, anxiety, alcohol abuse, tobacco abuse disorder on Suboxone is presenting to the ED with a chief complaint of abdominal pain associated with nausea and one time vomiting. Denies any hematemesis. Denies any diarrhea. Ultrasound of the abdomen is normal. Patient has received multiple doses of morphine in the ED. Discussing medical staff and ED physicians. She is septic with leukocytosis and hypotension. Lactic acid is initially elevated Patient had pancultures obtained and the IV antibiotics were started in hospital list team is called to admit the patient. During my examination patient is reporting severe abdominal pain but cussing the medical staff at the same time. Admits using cocaine, marijuana, drinking alcohol and also using IV drugs. Patient was jittery during my examination, answering questions comfortably  PAST MEDICAL HISTORY:   Past Medical History:  Diagnosis Date  . Anxiety   . Bipolar 1 disorder ([name])     PAST SURGICAL HISTOIRY:  History reviewed. No pertinent surgical history.  SOCIAL HISTORY:   Social History  Substance Use Topics  . Smoking status: Current Every Day Smoker    Packs/day: 2.00    Years: 10.00    Types: Cigarettes  . Smokeless tobacco: Never Used     Comment: Pt would like the nicotine gum  . Alcohol use 6.0 oz/week    10 Shots of liquor per week    FAMILY HISTORY:  No family history on file.  DRUG ALLERGIES:   Allergies  Allergen Reactions  . Haldol [Haloperidol]     REVIEW OF SYSTEMS:  CONSTITUTIONAL: No fever,  fatigue or weakness.  EYES: No blurred or double vision.  EARS, NOSE, AND THROAT: No tinnitus or ear pain.  RESPIRATORY: No cough, shortness of breath, wheezing or hemoptysis.  CARDIOVASCULAR: No chest pain, orthopnea, edema.  GASTROINTESTINAL: Patient is reporting diffuse abdominal pain, nausea and vomiting, denies any diarrhea or hematemesis.  GENITOURINARY: No dysuria, hematuria.  ENDOCRINE: No polyuria, nocturia,  HEMATOLOGY: No anemia, easy bruising or bleeding SKIN: No rash or lesion. MUSCULOSKELETAL: No joint pain or arthritis.   NEUROLOGIC: No tingling, numbness, weakness.  PSYCHIATRY: No anxiety or depression.   MEDICATIONS AT HOME:   Prior to Admission medications   Medication Sig Start Date End Date Taking? Authorizing Provider  buprenorphine (SUBUTEX) 2 MG SUBL SL tablet Place 4 mg under the tongue daily.   Yes [provider]  gabapentin (NEURONTIN) 300 MG capsule Take 3 capsules (900 mg total) by mouth 3 (three) times daily. 10/09/16  Yes Pucilowska, Jolanta B, MD  lithium carbonate 300 MG capsule Take 1 capsule (300 mg total) by mouth 3 (three) times daily with meals. 10/09/16  Yes Pucilowska, Jolanta B, MD  traZODone (DESYREL) 150 MG tablet Take 1 tablet (150 mg total) by mouth at bedtime. 10/09/16  Yes Pucilowska, Jolanta B, MD      VITAL SIGNS:  Blood pressure (!) 143/94, pulse (!) 119, temperature 100.2 F (37.9 C), temperature source Oral, resp. rate 19, height  (1.575 m), weight 69.4 kg (153 lb), last menstrual period 10/29/2016, SpO2 100 %.  PHYSICAL EXAMINATION:  GENERAL:  31 y.o.-year-old patient lying in the bed with no acute distress.  EYES: Pupils equal, round, reactive to light and accommodation. No scleral icterus. Extraocular muscles intact.  HEENT: Head atraumatic, normocephalic. Oropharynx and nasopharynx clear.  NECK:  Supple, no jugular venous distention. No thyroid enlargement, no tenderness.  LUNGS: Normal breath sounds bilaterally, no  wheezing, rales,rhonchi or crepitation. No use of accessory muscles of respiration.  CARDIOVASCULAR: S1, S2 normal. No murmurs, rubs, or gallops.  ABDOMEN: Soft, Abdominal tenderness is disproportionately high, withdrawing with minimal touch nondistended. Bowel sounds present.  EXTREMITIES: No pedal edema, cyanosis, or clubbing.  NEUROLOGIC: Cranial nerves II through XII are intact. Muscle strength 5/5 in all extremities. Sensation intact. Gait not checked.  PSYCHIATRIC: The patient is alert and oriented x 3.  SKIN: No obvious rash, lesion, or ulcer.   LABORATORY PANEL:   CBC  Recent Labs Lab 10/30/16 0424  WBC 28.2*  HGB 14.1  HCT 39.8  PLT 295   ------------------------------------------------------------------------------------------------------------------  Chemistries   Recent Labs Lab 10/30/16 0424  NA 138  K 3.5  CL 102  CO2 22  GLUCOSE 155*  BUN 9  CREATININE 0.58  CALCIUM 8.5*  AST 28  ALT 18  ALKPHOS 80  BILITOT 0.6   ------------------------------------------------------------------------------------------------------------------  Cardiac Enzymes No results for input(s): TROPONINI in the last 168 hours. ------------------------------------------------------------------------------------------------------------------  RADIOLOGY:  Dg Chest 2 View  Result Date: 10/30/2016 CLINICAL DATA:  Tachycardia and abdominal pain EXAM: CHEST  2 VIEW COMPARISON:  None. FINDINGS: Normal heart size and mediastinal contours. No acute infiltrate or edema. No effusion or pneumothorax. No acute osseous findings. IMPRESSION: Negative chest. Electronically Signed   By: Marnee Spring M.D.   On: 10/30/2016 10:19   Ct Abdomen Pelvis W Contrast  Result Date: 10/30/2016 CLINICAL DATA:  Abdominal pain, right upper quadrant pain EXAM: CT ABDOMEN AND PELVIS WITH CONTRAST TECHNIQUE: Multidetector CT imaging of the abdomen and pelvis was performed using the standard protocol following  bolus administration of intravenous contrast. CONTRAST:  ISOVUE-300 IOPAMIDOL (ISOVUE-300) INJECTION 61% COMPARISON:  None. FINDINGS: Lower chest: Lung bases are clear. No effusions. Heart is normal size. Hepatobiliary: Fatty infiltration of the liver. No focal abnormality or biliary ductal dilatation. Gallbladder unremarkable. Pancreas: No focal abnormality or ductal dilatation. Spleen: No focal abnormality.  Normal size. Adrenals/Urinary Tract: No adrenal abnormality. No focal renal abnormality. No stones or hydronephrosis. Urinary bladder is unremarkable. Stomach/Bowel: Normal retrocecal appendix. Stomach, large and small bowel grossly unremarkable. Vascular/Lymphatic: No evidence of aneurysm or adenopathy. Reproductive: Uterus and adnexa unremarkable.  No mass. Other: No free fluid or free air. Musculoskeletal: No acute bony abnormality. IMPRESSION: Fatty infiltration of the liver. Normal retrocecal appendix. No acute findings. Electronically Signed   By: Charlett Nose M.D.   On: 10/30/2016 08:44   US Abdomen Limited Ruq  Result Date: 10/30/2016 CLINICAL DATA:  Right upper quadrant pain for several hours. EXAM: ULTRASOUND ABDOMEN LIMITED RIGHT UPPER QUADRANT COMPARISON:  None. FINDINGS: Gallbladder: No gallstones or wall thickening visualized. No sonographic Murphy sign noted by sonographer. Common bile duct: Diameter: 4.2 mm, normal Liver: No focal lesion identified. Within normal limits in parenchymal echogenicity. Portal vein is patent on color Doppler imaging with normal direction of blood flow towards the liver. IMPRESSION: Normal examination. Electronically Signed   By: Burman Nieves M.D.   On: 10/30/2016 06:26    EKG:   Orders placed or performed during the hospital encounter of 10/30/16  . ED EKG  . ED EKG  .  EKG 12-Lead  . EKG 12-Lead    IMPRESSION AND PLAN:  Tonya Huff  is a 31 y.o. female with a known history of Bipolar disorder, drug abuse, anxiety, alcohol abuse,  tobacco abuse disorder on Suboxone is presenting to the ED with a chief complaint of abdominal pain associated with nausea and one time vomiting. Denies any hematemesis. Denies any diarrhea. Ultrasound of the abdomen is normal. Patient has received multiple doses of morphine in the ED. Discussing medical staff and ED physicians. She is septic with leukocytosis and hypotension. Lactic acid is initially elevated   # sepsis-Unclear etiology Admit patient to MedSurg unit Meets septic criteria with hypotension, tachycardia, elevated lactic acid and leukocytosis\ Chest x-rays negative Hydrated with IV fluids Empiric antibiotics IV Zosyn and vancomycin Consult infectious disease  Follow-up blood cultures and urine cultures which are pending Will get echocardiogram  #Acute abdominal pain with nausea and vomiting  ? withdrawl Pain management as needed, Pepcid IV fluids and supportive treatment Antiemetics Ultrasound of the abdomen is normal Will get CT abdomen pelvis  #History of bipolar disorder Lithium levels are low normal will resume lithium Psych consult placed  #History of drug abuse On Suboxone Will resume the same and pain management as needed Continue Neurontin  #Alcohol and tobacco abuse disorder CIWA Patient will be benefited with outpatient [facility] follow-up Psychiatry consult is placed Counseled patient to quit smoking for 4 minutes. We'll start her on nicotine patch    All the records are reviewed and case discussed with ED provider. Management plans discussed with the patient, family and they are in agreement.  CODE STATUS: fc  TOTAL TIME TAKING CARE OF THIS PATIENT: 45  minutes.   Note: This dictation was prepared with Dragon dictation along with smaller phrase technology. Any transcriptional errors that result from this process are unintentional.  Ramonita Lab M.D on 10/30/2016 at 11:46 AM  Between 7am to 6pm - Pager - [phone]  After 6pm go to [email or web address] -  password [email or web address]  [facility]  Office  [phone]  CC: Primary care physician; Patient, No Pcp Per

## 2016-10-30 NOTE — Progress Notes (Signed)
*  PRELIMINARY RESULTS* Echocardiogram 2D Echocardiogram has been performed.  Cristela Blue 10/30/2016, 3:06 PM

## 2016-10-30 NOTE — ED Notes (Signed)
Patient transported to Ultrasound 

## 2016-10-30 NOTE — ED Provider Notes (Addendum)
 -----------------------------------------   11:03 AM on 10/30/2016 -----------------------------------------  Clinical Course as of Oct 30 1101  Fri Oct 30, 2016  4782 Patient currently in ultrasound. Noted leukocytosis and elevated lactate. Once patient returns from ultrasound, will initiate IV antibiotics for intra-abdominal infection. Most likely patient will require CT scan to evaluate intra-abdominal etiology if ultrasound is unremarkable.  [JS]  [id number] Patient returned from ultrasound; complaining of pain. Will administer morphine.  [JS]  0706 Patient cursing, no relief with morphine or Ativan. Dilaudid ordered. Encouraged patient to drink oral contrast if she can. Care transferred to Dr. Scotty Court. If patient is unable to tolerate oral contrast, she should be scanned without oral contrast to evaluate intra-abdominal etiology of her pain. Anticipate hospital admission; medicine versus surgery.  [JS]  [address]ty [address]) abd pain somewhat improved.  Hr 115. 99.8 degres orally. 94/20. Lactate 2.3.  Continue IVF. Add Vanc, plan to admit due to SIRS and IVDU. Skin exam does not reveal any embolic phenomena  [PS]    Clinical Course User Index [JS] Irean Hong, MD [PS] Sharman Cheek, MD   Chest x-ray negative. Case discussed with the hospitalist for further evaluation.   Sharman Cheek, MD 10/30/16 1104  ----------------------------------------- 11:43 AM on 10/30/2016 -----------------------------------------  EKG interpreted by me Sinus tachycardia rate 1:30, normal axis and intervals. Normal QRS. No acute ischemic changes. 5 PVCs on the strip.    Sharman Cheek, MD 10/30/16 1143

## 2016-10-30 NOTE — ED Notes (Signed)
Patient transported to CT 

## 2016-10-30 NOTE — Progress Notes (Signed)
Patient was verbally abusive when I attempted to check her skin at admission. Nurse was unable to complete this task at this time.

## 2016-10-30 NOTE — ED Notes (Signed)
Pt bib [facility] from home d/t RUQ pain that woke pt from sleep approx 0300. Pt reports Nausea. States last cocaine use yesterday.

## 2016-10-30 NOTE — ED Notes (Signed)
Patient refusing lactic redraw

## 2016-10-31 DIAGNOSIS — F314 Bipolar disorder, current episode depressed, severe, without psychotic features: Secondary | ICD-10-CM

## 2016-10-31 LAB — ECHOCARDIOGRAM COMPLETE
HEIGHTINCHES: 62 in
Weight: 2448 oz

## 2016-10-31 LAB — BASIC METABOLIC PANEL
ANION GAP: 8 (ref 5–15)
BUN: 10 mg/dL (ref 6–20)
CO2: 23 mmol/L (ref 22–32)
Calcium: 8.4 mg/dL — ABNORMAL LOW (ref 8.9–10.3)
Chloride: 106 mmol/L (ref 101–111)
Creatinine, Ser: 0.72 mg/dL (ref 0.44–1.00)
Glucose, Bld: 119 mg/dL — ABNORMAL HIGH (ref 65–99)
POTASSIUM: 3.5 mmol/L (ref 3.5–5.1)
SODIUM: 137 mmol/L (ref 135–145)

## 2016-10-31 LAB — CBC
HCT: 36 % (ref 35.0–47.0)
Hemoglobin: 12.5 g/dL (ref 12.0–16.0)
MCH: 34 pg (ref 26.0–34.0)
MCHC: 34.8 g/dL (ref 32.0–36.0)
MCV: 97.8 fL (ref 80.0–100.0)
PLATELETS: 188 10*3/uL (ref 150–440)
RBC: 3.68 MIL/uL — AB (ref 3.80–5.20)
RDW: 13.7 % (ref 11.5–14.5)
WBC: 7.3 10*3/uL (ref 3.6–11.0)

## 2016-10-31 LAB — LACTIC ACID, PLASMA: LACTIC ACID, VENOUS: 1.2 mmol/L (ref 0.5–1.9)

## 2016-10-31 LAB — TSH: TSH: 2.422 u[IU]/mL (ref 0.350–4.500)

## 2016-10-31 MED ORDER — BUPRENORPHINE HCL-NALOXONE HCL 8-2 MG SL SUBL: 1.0000 | SUBLINGUAL_TABLET | Freq: Two times a day (BID) | SUBLINGUAL | Status: DC

## 2016-10-31 NOTE — Progress Notes (Signed)
     Tonya Huff was admitted to the [facility] on 10/30/2016 for an acute medical condition and is being Discharged on  10/31/2016 . She will need another 2 days for recovery and so advised to stay away from work until then. So please excuse her from work for the above  Days. Should be able to return to work  without any restrictions from 11/02/16.  Call Enid Baas  MD, [facility] at  [phone] with questions.  Enid Baas M.D on 10/31/2016,at 11:30 AM  [facility] [address] [place] [address]

## 2016-10-31 NOTE — Consult Note (Signed)
[facility] Consult   Reason for Consult:  Consult for a 31 year old woman with a history ofbipolar disorder and substance abuse currently in the hospital with abdominal pain Referring Physician: Dede Query Patient Identification: Tonya Huff MRN:  [id number] Principal Diagnosis: Bipolar I disorder, most recent episode depressed, severe without psychotic features [facility]) Diagnosis:   Patient Active Problem List   Diagnosis Date Noted  . Sepsis ([place]) [A41.9] 10/30/2016  . Tobacco use disorder [F17.200] 10/08/2016  . Opioid use disorder, mild, in early remission, on maintenance therapy, abuse [F11.11] 10/08/2016  . Alcohol use disorder, moderate, dependence ([place]) [F10.20] 10/07/2016  . Cocaine use disorder, moderate, dependence ([place]) [F14.20] 10/07/2016  . 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 Suicidal ideation [R45.851] 10/07/2016  . Bipolar I disorder, most recent episode depressed, severe without psychotic features ([place]) [F31.4] 10/07/2016    Total Time spent with patient: 1 hour  Subjective:   Tonya Huff is a 31 y.o. female patient admitted with "I've been feeling really sick".  HPI:  Patient interviewed chart reviviewed. Presents to the emergency room with complaints of abdominal pain and sickness. Had an elevated white count and lactic acid. Was admitted to the medical service. She claims that she has been compliant with her outpatient prescription medicine as well. Patient was described as irritable andcursingat staffwhen she came to the emergency room.he tells me that today she is still having pain in her abdomen but is feeling a little better. Mood has been down but she denies any suicidal ideation. Denies any hallucinations or psychosis. She has not really had a stable place to live however and is under a lot of stress.  Social history: Patient's husband died traumatically fairly recently and she has been unstable ever since then. No stable place  to live currently.  Medical history: history of chronic pain. Currently diagnosed with sepsislthough it looks like her labs are better today.  Substance abuse history: Long-standingblems with abuse of multiple drugs including narcotics and cocaine. She is on Suboxone confirmed outpatient prescription. Currently appears to of relapsed into alcohol and cocaine. Past Psychiatric History: patient has prior psychiatrichospitalizations. Was recently at our hospital. History of bipolar disorder. History of suicidals and some self injury.  Risk to Self: Is patient at risk for suicide?: No Risk to Others:   Prior Inpatient Therapy:   Prior Outpatient Therapy:    Past Medical History:  Past Medical History:  Diagnosis Date  . Anxiety   . Bipolar 1 disorder ([place])    History reviewed. No pertinent surgical history. Family History: No family history on file. Family Psychiatric  History: family history of substance abuse Social History:  History  Alcohol Use  . 6.0 oz/week  . 10 Shots of liquor per week     History  Drug Use  . Types: Marijuana, Cocaine, IV    Comment: Marijuana twice a month, cocaine one time    Social History   Social History  . Marital status: Single    Spouse name: N/A  . Number of children: N/A  . Years of education: N/A   Social History Main Topics  . Smoking status: Current Every Day Smoker    Packs/day: 2.00    Years: 10.00    Types: Cigarettes  . Smokeless tobacco: Never Used     Comment: Pt would like the nicotine gum  . Alcohol use 6.0 oz/week    10 Shots of liquor per week  . Drug use: Yes    Types: Marijuana, Cocaine,  IV     Comment: Marijuana twice a month, cocaine one time  . Sexual activity: No   Other Topics Concern  . None   Social History Narrative  . None   Additional Social History:    Allergies:   Allergies  Allergen Reactions  . Haldol [Haloperidol]     Labs:  Results for orders placed or performed during the hospital  encounter of 10/30/16 (from the past 48 hour(s))  Lipase, blood     Status: None   Collection Time: 10/30/16  4:24 AM  Result Value Ref Range   Lipase 16 11 - 51 U/L  Comprehensive metabolic panel     Status: Abnormal   Collection Time: 10/30/16  4:24 AM  Result Value Ref Range   Sodium 138 135 - 145 mmol/L   Potassium 3.5 3.5 - 5.1 mmol/L   Chloride 102 101 - 111 mmol/L   CO2 22 22 - 32 mmol/L   Glucose, Bld 155 (H) 65 - 99 mg/dL   BUN 9 6 - 20 mg/dL   Creatinine, Ser 0.58 0.44 - 1.00 mg/dL   Calcium 8.5 (L) 8.9 - 10.3 mg/dL   Total Protein 7.4 6.5 - 8.1 g/dL   Albumin 4.0 3.5 - 5.0 g/dL   AST 28 15 - 41 U/L   ALT 18 14 - 54 U/L   Alkaline Phosphatase 80 38 - 126 U/L   Total Bilirubin 0.6 0.3 - 1.2 mg/dL   GFR calc non Af Amer >60 >60 mL/min   GFR calc Af Amer >60 >60 mL/min    Comment: (NOTE) The eGFR has been calculated using the CKD EPI equation. This calculation has not been validated in all clinical situations. eGFR's persistently <60 mL/min signify possible Chronic Kidney Disease.    Anion gap 14 5 - 15  CBC     Status: Abnormal   Collection Time: 10/30/16  4:24 AM  Result Value Ref Range   WBC 28.2 (H) 3.6 - 11.0 K/uL   RBC 4.17 3.80 - 5.20 MIL/uL   Hemoglobin 14.1 12.0 - 16.0 g/dL   HCT 39.8 35.0 - 47.0 %   MCV 95.3 80.0 - 100.0 fL   MCH 33.8 26.0 - 34.0 pg   MCHC 35.5 32.0 - 36.0 g/dL   RDW 14.2 11.5 - 14.5 %   Platelets 295 150 - 440 K/uL  Urinalysis, Complete w Microscopic     Status: Abnormal   Collection Time: 10/30/16  4:49 AM  Result Value Ref Range   Color, Urine YELLOW (A) YELLOW   APPearance CLEAR (A) CLEAR   Specific Gravity, Urine 1.015 1.005 - 1.030   pH 5.0 5.0 - 8.0   Glucose, UA 50 (A) NEGATIVE mg/dL   Hgb urine dipstick MODERATE (A) NEGATIVE   Bilirubin Urine NEGATIVE NEGATIVE   Ketones, ur NEGATIVE NEGATIVE mg/dL   Protein, ur NEGATIVE NEGATIVE mg/dL   Nitrite NEGATIVE NEGATIVE   Leukocytes, UA TRACE (A) NEGATIVE   RBC / HPF 0-5 0 -  5 RBC/hpf   WBC, UA 6-30 0 - 5 WBC/hpf   Bacteria, UA NONE SEEN NONE SEEN   Squamous Epithelial / LPF 0-5 (A) NONE SEEN   Mucus PRESENT   Urine Drug Screen, Qualitative     Status: Abnormal   Collection Time: 10/30/16  4:49 AM  Result Value Ref Range   Tricyclic, Ur Screen NONE DETECTED NONE DETECTED   Amphetamines, Ur Screen NONE DETECTED NONE DETECTED   MDMA (Ecstasy)Ur Screen NONE DETECTED NONE DETECTED  Cocaine Metabolite,Ur [place] POSITIVE (A) NONE DETECTED   Opiate, Ur Screen POSITIVE (A) NONE DETECTED   Phencyclidine (PCP) Ur S NONE DETECTED NONE DETECTED   Cannabinoid 50 Ng, Ur [place] POSITIVE (A) NONE DETECTED   Barbiturates, Ur Screen NONE DETECTED NONE DETECTED   Benzodiazepine, Ur Scrn NONE DETECTED NONE DETECTED   Methadone Scn, Ur NONE DETECTED NONE DETECTED    Comment: (NOTE) 100  Tricyclics, urine               Cutoff 1000 ng/mL 200  Amphetamines, urine             Cutoff 1000 ng/mL 300  MDMA (Ecstasy), urine           Cutoff 500 ng/mL 400  Cocaine Metabolite, urine       Cutoff 300 ng/mL 500  Opiate, urine                   Cutoff 300 ng/mL 600  Phencyclidine (PCP), urine      Cutoff 25 ng/mL 700  Cannabinoid, urine              Cutoff 50 ng/mL 800  Barbiturates, urine             Cutoff 200 ng/mL 900  Benzodiazepine, urine           Cutoff 200 ng/mL 1000 Methadone, urine                Cutoff 300 ng/mL 1100 1200 The urine drug screen provides only a preliminary, unconfirmed 1300 analytical test result and should not be used for non-medical 1400 purposes. Clinical consideration and professional judgment should 1500 be applied to any positive drug screen result due to possible 1600 interfering substances. A more specific alternate chemical method 1700 must be used in order to obtain a confirmed analytical result.  1800 Gas chromato graphy / mass spectrometry (GC/MS) is the preferred 1900 confirmatory method.   Lactic acid, plasma     Status: Abnormal   Collection  Time: 10/30/16  4:49 AM  Result Value Ref Range   Lactic Acid, Venous 3.2 (HH) 0.5 - 1.9 mmol/L    Comment: CRITICAL RESULT CALLED TO, READ BACK BY AND VERIFIED WITH ALLISON PATE AT 0541 10/30/16 ALV   Lithium level     Status: Abnormal   Collection Time: 10/30/16  4:49 AM  Result Value Ref Range   Lithium Lvl <0.06 (L) 0.60 - 1.20 mmol/L  Ethanol     Status: Abnormal   Collection Time: 10/30/16  4:49 AM  Result Value Ref Range   Alcohol, Ethyl (B) 178 (H) <5 mg/dL    Comment:        LOWEST DETECTABLE LIMIT FOR SERUM ALCOHOL IS 5 mg/dL FOR MEDICAL PURPOSES ONLY   Acetaminophen level     Status: Abnormal   Collection Time: 10/30/16  4:49 AM  Result Value Ref Range   Acetaminophen (Tylenol), Serum <10 (L) 10 - 30 ug/mL    Comment:        THERAPEUTIC CONCENTRATIONS VARY SIGNIFICANTLY. A RANGE OF 10-30 ug/mL MAY BE AN EFFECTIVE CONCENTRATION FOR MANY PATIENTS. HOWEVER, SOME ARE BEST TREATED AT CONCENTRATIONS OUTSIDE THIS RANGE. ACETAMINOPHEN CONCENTRATIONS >150 ug/mL AT 4 HOURS AFTER INGESTION AND >50 ug/mL AT 12 HOURS AFTER INGESTION ARE OFTEN ASSOCIATED WITH TOXIC REACTIONS.   Salicylate level     Status: None   Collection Time: 10/30/16  4:49 AM  Result Value Ref Range   Salicylate Lvl <7.0  2.8 - 30.0 mg/dL  POC urine preg, ED     Status: None   Collection Time: 10/30/16  6:53 AM  Result Value Ref Range   Preg Test, Ur Negative Negative  Lactic acid, plasma     Status: Abnormal   Collection Time: 10/30/16  8:07 AM  Result Value Ref Range   Lactic Acid, Venous 2.3 (HH) 0.5 - 1.9 mmol/L    Comment: CRITICAL RESULT CALLED TO, READ BACK BY AND VERIFIED WITH DONALD SWEENEY ON 10/30/16 AT 0845 BY JAG   Culture, blood (routine x 2)     Status: None (Preliminary result)   Collection Time: 10/30/16 10:35 AM  Result Value Ref Range   Specimen Description BLOOD RIGHT AC    Special Requests      BOTTLES DRAWN AEROBIC AND ANAEROBIC Blood Culture adequate volume   Culture  NO GROWTH < 24 HOURS    Report Status PENDING   Culture, blood (routine x 2)     Status: None (Preliminary result)   Collection Time: 10/30/16 10:35 AM  Result Value Ref Range   Specimen Description BLOOD LEFT HAND    Special Requests      BOTTLES DRAWN AEROBIC AND ANAEROBIC Blood Culture adequate volume   Culture NO GROWTH < 24 HOURS    Report Status PENDING   TSH     Status: None   Collection Time: 10/31/16  4:24 AM  Result Value Ref Range   TSH 2.422 0.350 - 4.500 uIU/mL    Comment: Performed by a 3rd Generation assay with a functional sensitivity of <=0.01 uIU/mL.  Lactic acid, plasma     Status: None   Collection Time: 10/31/16 10:45 AM  Result Value Ref Range   Lactic Acid, Venous 1.2 0.5 - 1.9 mmol/L  CBC     Status: Abnormal   Collection Time: 10/31/16 10:45 AM  Result Value Ref Range   WBC 7.3 3.6 - 11.0 K/uL   RBC 3.68 (L) 3.80 - 5.20 MIL/uL   Hemoglobin 12.5 12.0 - 16.0 g/dL   HCT 36.0 35.0 - 47.0 %   MCV 97.8 80.0 - 100.0 fL   MCH 34.0 26.0 - 34.0 pg   MCHC 34.8 32.0 - 36.0 g/dL   RDW 13.7 11.5 - 14.5 %   Platelets 188 150 - 440 K/uL  Basic metabolic panel     Status: Abnormal   Collection Time: 10/31/16 10:45 AM  Result Value Ref Range   Sodium 137 135 - 145 mmol/L   Potassium 3.5 3.5 - 5.1 mmol/L   Chloride 106 101 - 111 mmol/L   CO2 23 22 - 32 mmol/L   Glucose, Bld 119 (H) 65 - 99 mg/dL   BUN 10 6 - 20 mg/dL   Creatinine, Ser 0.72 0.44 - 1.00 mg/dL   Calcium 8.4 (L) 8.9 - 10.3 mg/dL   GFR calc non Af Amer >60 >60 mL/min   GFR calc Af Amer >60 >60 mL/min    Comment: (NOTE) The eGFR has been calculated using the CKD EPI equation. This calculation has not been validated in all clinical situations. eGFR's persistently <60 mL/min signify possible Chronic Kidney Disease.    Anion gap 8 5 - 15    Current Facility-Administered Medications  Medication Dose Route Frequency Provider Last Rate Last Dose  . buprenorphine-naloxone (SUBOXONE) 8-2 mg per SL  tablet 1 tablet  1 tablet Sublingual BID Numa Schroeter T, MD      . enoxaparin (LOVENOX) injection 40 mg  40  mg Subcutaneous Q24H Gouru, Aruna, MD   40 mg at 10/30/16 1948  . folic acid (FOLVITE) tablet 1 mg  1 mg Oral Daily Gouru, Aruna, MD   1 mg at 10/31/16 1000  . gabapentin (NEURONTIN) capsule 900 mg  900 mg Oral TID Ramonita Lab, MD   900 mg at 10/31/16 0949  . ketorolac (TORADOL) 15 MG/ML injection 15 mg  15 mg Intravenous Q6H PRN Gouru, Aruna, MD   15 mg at 10/31/16 0135  . lithium carbonate capsule 300 mg  300 mg Oral TID WC Gouru, Aruna, MD   300 mg at 10/31/16 0949  . LORazepam (ATIVAN) tablet 1 mg  1 mg Oral Q6H PRN Gouru, Aruna, MD   1 mg at 10/31/16 0528   Or  . LORazepam (ATIVAN) injection 1 mg  1 mg Intravenous Q6H PRN Gouru, Aruna, MD      . multivitamin with minerals tablet 1 tablet  1 tablet Oral Daily Gouru, Aruna, MD   1 tablet at 10/31/16 0949  . nicotine polacrilex (NICORETTE) gum 2 mg  2 mg Oral PRN Shaune Pollack, MD   2 mg at 10/31/16 0529  . ondansetron (ZOFRAN) tablet 4 mg  4 mg Oral Q6H PRN Gouru, Aruna, MD   4 mg at 10/31/16 0528   Or  . ondansetron (ZOFRAN) injection 4 mg  4 mg Intravenous Q6H PRN Gouru, Aruna, MD      . pantoprazole (PROTONIX) injection 40 mg  40 mg Intravenous Daily Gouru, Aruna, MD   40 mg at 10/31/16 0949  . piperacillin-tazobactam (ZOSYN) IVPB 3.375 g  3.375 g Intravenous Q8H Gardner Candle, RPH   Stopped at 10/31/16 7394  . thiamine (VITAMIN B-1) tablet 100 mg  100 mg Oral Daily Gouru, Aruna, MD   100 mg at 10/31/16 1000   Or  . thiamine (B-1) injection 100 mg  100 mg Intravenous Daily Gouru, Aruna, MD      . traZODone (DESYREL) tablet 150 mg  150 mg Oral QHS Gouru, Aruna, MD   150 mg at 10/30/16 2135  . vancomycin (VANCOCIN) IVPB 1000 mg/200 mL premix  1,000 mg Intravenous Q12H Gardner Candle, [facility]   Stopped at 10/31/16 7693    Musculoskeletal: Strength & Muscle Tone: within normal limits Gait & Station: normal Patient leans:  N/A  Psychiatric Specialty Exam: Physical Exam  Nursing note and vitals reviewed. Constitutional: She appears well-developed and well-nourished.  HENT:  Head: Normocephalic and atraumatic.  Eyes: Pupils are equal, round, and reactive to light. Conjunctivae are normal.  Neck: Normal range of motion.  Cardiovascular: Regular rhythm and normal heart sounds.   Respiratory: Effort normal. No respiratory distress.  GI: Soft.  Musculoskeletal: Normal range of motion.  Neurological: She is alert.  Skin: Skin is warm and dry.  Psychiatric: Her speech is normal. Her mood appears anxious. She is agitated. She is not aggressive. Thought content is not paranoid. Cognition and memory are impaired. She expresses impulsivity. She exhibits a depressed mood. She expresses no homicidal and no suicidal ideation.    Review of Systems  Constitutional: Negative.   HENT: Negative.   Eyes: Negative.   Respiratory: Negative.   Cardiovascular: Negative.   Gastrointestinal: Positive for abdominal pain.  Musculoskeletal: Negative.   Skin: Negative.   Neurological: Negative.   Psychiatric/Behavioral: Positive for depression, memory loss and substance abuse. Negative for hallucinations and suicidal ideas. The patient is nervous/anxious and has insomnia.     Blood pressure (!) 130/56, pulse 82, temperature  97.8 F (36.6 C), temperature source Oral, resp. rate 16, height '5\' 2"'$  (1.575 m), weight 69.4 kg (153 lb), last menstrual period 10/29/2016, SpO2 100 %.Body mass index is 27.98 kg/m.  General Appearance: Disheveled  Eye Contact:  Fair  Speech:  Clear and Coherent  Volume:  Decreased  Mood:  Anxious and Dysphoric  Affect:  Congruent  Thought Process:  Goal Directed  Orientation:  Full (Time, Place, and Person)  Thought Content:  Logical  Suicidal Thoughts:  No  Homicidal Thoughts:  No  Memory:  Immediate;   Good Recent;   Fair Remote;   Fair  Judgement:  Fair  Insight:  Fair  Psychomotor Activity:   Decreased  Concentration:  Concentration: Fair  Recall:  [employer] of Knowledge:  Fair  Language:  Fair  Akathisia:  No  Handed:  Right  AIMS (if indicated):     Assets:  Communication Skills Desire for Improvement Physical Health Resilience  ADL's:  Intact  Cognition:  WNL  Sleep:        Treatment Plan Summary: Plan as far as her depression the patient appears to be stable. Situational worsening of her mood. Has outpatient referrals in place. No need for new prescriptions. Does not need hospitalization. Patient has relapsed intoance abuse but has outpatient providers for that. Does not need medical detox or further hospitalization. Supportive counseling and encouragement and review of treatment plan. No new prescriptions.  Disposition: Patient does not meet criteria for psychiatric inpatient admission. Supportive therapy provided about ongoing stressors.  Alethia Berthold, MD 10/31/2016 1:27 PM

## 2016-11-01 LAB — URINE CULTURE: Culture: NO GROWTH

## 2016-11-01 NOTE — Discharge Summary (Signed)
[employer] [facility] [place] [facility]   PATIENT NAME: Tonya Huff    MR#:  [id number]  DATE OF BIRTH:  21-Dec-1985  DATE OF ADMISSION:  10/30/2016   ADMITTING PHYSICIAN: Ramonita Lab, MD  DATE OF DISCHARGE: 10/31/2016  1:53 PM  PRIMARY CARE PHYSICIAN: Patient, No Pcp Per   ADMISSION DIAGNOSIS:   Marijuana abuse [F12.10] Cocaine abuse [F14.10] Right upper quadrant abdominal pain [R10.11] Elevated lactic acid level [R79.89] Leukocytosis, unspecified type [D72.829]  DISCHARGE DIAGNOSIS:   Principal Problem:   Bipolar I disorder, most recent episode depressed, severe without psychotic features ([name]) Active Problems:   Alcohol use disorder, moderate, dependence ([name])   Cocaine use disorder, moderate, dependence ([name])   Opioid use disorder, mild, in early remission, on maintenance therapy, abuse   Sepsis ([name])   SECONDARY DIAGNOSIS:   Past Medical History:  Diagnosis Date  . Anxiety   . Bipolar 1 disorder [facility])     HOSPITAL COURSE:   31 year old female with past medical history significant for bipolar disorder, alcohol abuse and polysubstance abuse presents to hospital secondary to abdominal pain.  #1 sepsis-ruled out.Patient's presentation was more from dehydration and active substance abuse/withdrawal. -On admission her lactic acid and white cells were elevated. -With IV fluids, WBC is normalized. Lactic acid has improved as well. -Had GI symptoms, however CT of the abdomen was normal. She has relapsed to use alcohol and cocaine again -echocardiogram was done, no vegetations noted. Blood cultures were negative. -Is being discharged.  #2 polysubstance abuse-relapsed on alcohol use, cocaine and marijuana. -No prescriptions at discharge. Strongly counseled. -Appreciate psych consult. Does not meet criteria for inpatient admission for detox. -treatment groups advised  #3 bipolar disorder-patient on gabapentin, lithium and trazodone. Advised to  restart her home medications. No new prescriptions given.  Being discharged home    DISCHARGE CONDITIONS:   Guarded CONSULTS OBTAINED:   Treatment Team:  Clapacs, Jackquline Denmark, MD  DRUG ALLERGIES:   Allergies  Allergen Reactions  . Haldol [Haloperidol]    DISCHARGE MEDICATIONS:   Allergies as of 10/31/2016      Reactions   Haldol [haloperidol]       Medication List    STOP taking these medications   buprenorphine 2 MG Subl SL tablet Commonly known as:  SUBUTEX     TAKE these medications   gabapentin 300 MG capsule Commonly known as:  NEURONTIN Take 3 capsules (900 mg total) by mouth 3 (three) times daily.   lithium carbonate 300 MG capsule Take 1 capsule (300 mg total) by mouth 3 (three) times daily with meals.   traZODone 150 MG tablet Commonly known as:  DESYREL Take 1 tablet (150 mg total) by mouth at bedtime.            Discharge Care Instructions        Start     Ordered   10/31/16 0000  Activity as tolerated - No restrictions     10/31/16 1129   10/31/16 0000  Diet general     10/31/16 1129       DISCHARGE INSTRUCTIONS:   1. Psychiatry follow-up in 1-2 weeks  DIET:   Regular diet  ACTIVITY:   Activity as tolerated  OXYGEN:   Home Oxygen: No.  Oxygen Delivery: room air  DISCHARGE LOCATION:   home   If you experience worsening of your admission symptoms, develop shortness of breath, life threatening emergency, suicidal or homicidal thoughts you must seek medical attention immediately by calling 911  or calling your MD immediately  if symptoms less severe.  You Must read complete instructions/literature along with all the possible adverse reactions/side effects for all the Medicines you take and that have been prescribed to you. Take any new Medicines after you have completely understood and accpet all the possible adverse reactions/side effects.   Please note  You were cared for by a hospitalist during your hospital stay. If you  have any questions about your discharge medications or the care you received while you were in the hospital after you are discharged, you can call the unit and asked to speak with the hospitalist on call if the hospitalist that took care of you is not available. Once you are discharged, your primary care physician will handle any further medical issues. Please note that NO REFILLS for any discharge medications will be authorized once you are discharged, as it is imperative that you return to your primary care physician (or establish a relationship with a primary care physician if you do not have one) for your aftercare needs so that they can reassess your need for medications and monitor your lab values.    On the day of Discharge:  VITAL SIGNS:   Blood pressure (!) 130/56, pulse 82, temperature 97.8 F (36.6 C), temperature source Oral, resp. rate 16, height  (1.575 m), weight 69.4 kg (153 lb), last menstrual period 10/29/2016, SpO2 100 %.  PHYSICAL EXAMINATION:    GENERAL:  31 y.o.-year-old patient lying in the bed with no acute distress.  EYES: Pupils equal, round, reactive to light and accommodation. No scleral icterus. Extraocular muscles intact.  HEENT: Head atraumatic, normocephalic. Oropharynx and nasopharynx clear.  NECK:  Supple, no jugular venous distention. No thyroid enlargement, no tenderness.  LUNGS: Normal breath sounds bilaterally, no wheezing, rales,rhonchi or crepitation. No use of accessory muscles of respiration.  CARDIOVASCULAR: S1, S2 normal. No murmurs, rubs, or gallops.  ABDOMEN: Soft, minimal diffuse-tenderness, no guarding or rigidity, non-distended. Bowel sounds present. No organomegaly or mass.  EXTREMITIES: No pedal edema, cyanosis, or clubbing.  NEUROLOGIC: Cranial nerves II through XII are intact. Muscle strength 5/5 in all extremities. Sensation intact. Gait not checked.  PSYCHIATRIC: The patient is alert and oriented x 3. Mood changes SKIN: No obvious  rash, lesion, or ulcer.   DATA REVIEW:   CBC  Recent Labs Lab 10/31/16 1045  WBC 7.3  HGB 12.5  HCT 36.0  PLT 188    Chemistries   Recent Labs Lab 10/30/16 0424 10/31/16 1045  NA 138 137  K 3.5 3.5  CL 102 106  CO2 22 23  GLUCOSE 155* 119*  BUN 9 10  CREATININE 0.58 0.72  CALCIUM 8.5* 8.4*  AST 28  --   ALT 18  --   ALKPHOS 80  --   BILITOT 0.6  --      Microbiology Results  Results for orders placed or performed during the hospital encounter of 10/30/16  Urine culture     Status: None   Collection Time: 10/30/16  4:49 AM  Result Value Ref Range Status   Specimen Description URINE, CLEAN CATCH  Final   Special Requests NONE  Final   Culture   Final    NO GROWTH Performed at [facility], [address] [name], [place] [address]    Report Status 11/01/2016 FINAL  Final  Culture, blood (routine x 2)     Status: None (Preliminary result)   Collection Time: 10/30/16 10:35 AM  Result  Value Ref Range Status   Specimen Description BLOOD RIGHT AC  Final   Special Requests   Final    BOTTLES DRAWN AEROBIC AND ANAEROBIC Blood Culture adequate volume   Culture NO GROWTH 2 DAYS  Final   Report Status PENDING  Incomplete  Culture, blood (routine x 2)     Status: None (Preliminary result)   Collection Time: 10/30/16 10:35 AM  Result Value Ref Range Status   Specimen Description BLOOD LEFT HAND  Final   Special Requests   Final    BOTTLES DRAWN AEROBIC AND ANAEROBIC Blood Culture adequate volume   Culture NO GROWTH 2 DAYS  Final   Report Status PENDING  Incomplete    RADIOLOGY:  No results found.   Management plans discussed with the patient, family and they are in agreement.  CODE STATUS:  Code Status History    Date Active Date Inactive Code Status Order ID Comments User Context   10/30/2016 12:32 PM 10/31/2016  4:58 PM Full Code [id number]  Ramonita Lab, MD Inpatient   10/07/2016 11:13 PM 10/07/2016 11:13 PM Full Code [id number]  Audery Amel, MD  Inpatient   10/07/2016 11:13 PM 10/09/2016  9:06 PM Full Code [id number]  Audery Amel, MD Inpatient   10/07/2016  7:27 AM 10/07/2016 10:31 PM Full Code [id number]  Jeanmarie Plant, MD ED      TOTAL TIME TAKING CARE OF THIS PATIENT: 37  minutes.    Enid Baas M.D on 11/01/2016 at 1:36 PM  Between 7am to 6pm - Pager - [phone]  After 6pm go to [email or web address] - Social research officer, government  [facility]  Office  [phone]  CC: Primary care physician; Patient, No Pcp Per   Note: This dictation was prepared with Dragon dictation along with smaller phrase technology. Any transcriptional errors that result from this process are unintentional.

## 2016-11-02 LAB — HIV ANTIBODY (ROUTINE TESTING W REFLEX): HIV SCREEN 4TH GENERATION: NONREACTIVE

## 2016-11-04 LAB — CULTURE, BLOOD (ROUTINE X 2)
CULTURE: NO GROWTH
Culture: NO GROWTH
SPECIAL REQUESTS: ADEQUATE
SPECIAL REQUESTS: ADEQUATE

## 2017-02-02 ENCOUNTER — Encounter: Payer: Self-pay | Admitting: Emergency Medicine

## 2017-02-02 ENCOUNTER — Emergency Department
Admission: EM | Admit: 2017-02-02 | Discharge: 2017-02-02 | Disposition: A | Discharge status: Home / Self Care | Payer: Medicaid Other | Attending: Student in an Organized Health Care Education/Training Program | Admitting: Student in an Organized Health Care Education/Training Program

## 2017-02-02 ENCOUNTER — Other Ambulatory Visit: Payer: Self-pay

## 2017-02-02 DIAGNOSIS — Y999 Unspecified external cause status: Secondary | ICD-10-CM | POA: Insufficient documentation

## 2017-02-02 DIAGNOSIS — S60812A Abrasion of left wrist, initial encounter: Secondary | ICD-10-CM | POA: Insufficient documentation

## 2017-02-02 DIAGNOSIS — Y939 Activity, unspecified: Secondary | ICD-10-CM | POA: Insufficient documentation

## 2017-02-02 DIAGNOSIS — Z79899 Other long term (current) drug therapy: Secondary | ICD-10-CM | POA: Insufficient documentation

## 2017-02-02 DIAGNOSIS — F17210 Nicotine dependence, cigarettes, uncomplicated: Secondary | ICD-10-CM | POA: Insufficient documentation

## 2017-02-02 DIAGNOSIS — Y929 Unspecified place or not applicable: Secondary | ICD-10-CM | POA: Insufficient documentation

## 2017-02-02 DIAGNOSIS — X788XXA Intentional self-harm by other sharp object, initial encounter: Secondary | ICD-10-CM | POA: Insufficient documentation

## 2017-02-02 DIAGNOSIS — Z7289 Other problems related to lifestyle: Secondary | ICD-10-CM

## 2017-02-02 HISTORY — DX: Renal tubulo-interstitial disease, unspecified: N15.9

## 2017-02-02 HISTORY — DX: Diverticulitis of intestine, part unspecified, without perforation or abscess without bleeding: K57.92

## 2017-02-02 HISTORY — DX: Female pelvic inflammatory disease, unspecified: N73.9

## 2017-02-02 LAB — POCT PREGNANCY, URINE: Preg Test, Ur: NEGATIVE

## 2017-02-02 MED ORDER — BUPRENORPHINE HCL-NALOXONE HCL 8-2 MG SL SUBL
1.0000 | SUBLINGUAL_TABLET | Freq: Every day | SUBLINGUAL | Status: DC
  Administered 2017-02-02: 1 via SUBLINGUAL

## 2017-02-02 MED ORDER — BACITRACIN ZINC 500 UNIT/GM EX OINT
TOPICAL_OINTMENT | Freq: Once | CUTANEOUS | Status: AC
  Administered 2017-02-02: 1 via TOPICAL
  Filled 2017-02-02: qty 0.9

## 2017-02-02 NOTE — ED Provider Notes (Signed)
[facility] Emergency Department Provider Note    First MD Initiated Contact with Patient 02/02/17 2014     (approximate)  I have reviewed the triage vital signs and the nursing notes.   HISTORY  Chief Complaint Abrasion    HPI Tonya Huff is a 31 y.o. female with a history of anxiety, bipolar 1 disorder as well as self-reported history of cutting behavior for several years presents with boyfriend after self-inflicted razor cut to left wrist.  Denies any history of bleeding disorders.  Denies any intent to kill herself states that cutting makes her feel better.  States that she been more stressed out recently and has stopped taking Suboxone on her own.  Denies any hallucinations.  No homicidal intent.  States that she wants to go home and that her boyfriend got scared.  Past Medical History:  Diagnosis Date  . Anxiety   . Bipolar 1 disorder ([name])   . Diverticulitis   . Kidney infection   . PID (pelvic inflammatory disease)    No family history on file. History reviewed. No pertinent surgical history. Patient Active Problem List   Diagnosis Date Noted  . Sepsis ([name]) 10/30/2016  . Tobacco use disorder 10/08/2016  . Opioid use disorder, mild, in early remission, on maintenance therapy, abuse ([name]) 10/08/2016  . Alcohol use disorder, moderate, dependence ([name]) 10/07/2016  . Cocaine use disorder, moderate, dependence ([name]) 10/07/2016  . 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 Suicidal ideation 10/07/2016  . Bipolar I disorder, most recent episode depressed, severe without psychotic features ([name]) 10/07/2016      Prior to Admission medications   Medication Sig Start Date End Date Taking? Authorizing Provider  gabapentin (NEURONTIN) 300 MG capsule Take 3 capsules (900 mg total) by mouth 3 (three) times daily. 10/09/16   Pucilowska, Braulio ConteJolanta B, MD  lithium carbonate 300 MG capsule Take 1 capsule (300 mg total) by mouth 3 (three) times daily with  meals. 10/09/16   Pucilowska, Braulio ConteJolanta B, MD  traZODone (DESYREL) 150 MG tablet Take 1 tablet (150 mg total) by mouth at bedtime. 10/09/16   Pucilowska, Ellin GoodieJolanta B, MD    Allergies Haldol [haloperidol]    Social History Social History   Tobacco Use  . Smoking status: Current Every Day Smoker    Packs/day: 2.00    Years: 10.00    Pack years: 20.00    Types: Cigarettes  . Smokeless tobacco: Never Used  . Tobacco comment: Pt would like the nicotine gum  Substance Use Topics  . Alcohol use: Yes    Alcohol/week: 6.0 oz    Types: 10 Shots of liquor per week  . Drug use: Yes    Types: Marijuana, Cocaine, IV    Comment: Marijuana twice a month, cocaine one time    Review of Systems Patient denies headaches, rhinorrhea, blurry vision, numbness, shortness of breath, chest pain, edema, cough, abdominal pain, nausea, vomiting, diarrhea, dysuria, fevers, rashes or hallucinations unless otherwise stated above in HPI. ____________________________________________   PHYSICAL EXAM:  VITAL SIGNS: Vitals:   02/02/17 1941  BP: (!) 147/116  Pulse: (!) 58  Resp: (!) 22  Temp: 98.3 F (36.8 C)  SpO2: 99%    Constitutional: Alert and oriented. in no acute distress. Eyes: Conjunctivae are normal.  Head: Atraumatic. Nose: No congestion/rhinnorhea. Mouth/Throat: Mucous membranes are moist.   Neck: No stridor. Painless ROM.  Cardiovascular: Normal rate, regular rhythm. Grossly normal heart sounds.  Good peripheral circulation. Respiratory: Normal respiratory effort.  No  retractions. Lungs CTAB. Gastrointestinal: Soft and nontender. No distention. No abdominal bruits. No CVA tenderness. Genitourinary:  Musculoskeletal: No lower extremity tenderness nor edema.  No joint effusions. Neurologic:  Normal speech and language. No gross focal neurologic deficits are appreciated. No facial droop Skin:  Skin is warm, dry.  Three linear 5cm superficial cuts to left wrist.  Hemostatic, . No rash  noted. Psychiatric: agitated, but organized thought process ____________________________________________   LABS (all labs ordered are listed, but only abnormal results are displayed)  Results for orders placed or performed during the hospital encounter of 02/02/17 (from the past 24 hour(s))  Pregnancy, urine POC     Status: None   Collection Time: 02/02/17  8:07 PM  Result Value Ref Range   Preg Test, Ur NEGATIVE NEGATIVE   ____________________________________________ ____________________________________________   PROCEDURES  Procedure(s) performed:  Procedures    Critical Care performed: no ____________________________________________   INITIAL IMPRESSION / ASSESSMENT AND PLAN / ED COURSE  Pertinent labs & imaging results that were available during my care of the patient were reviewed by me and considered in my medical decision making (see chart for details).  DDX: Psychosis, delirium, medication effect, noncompliance, polysubstance abuse, Si, Hi, depression   Tonya Huff is a 31 y.o. who presents to the ED with a history of Suboxone use presents with self cutting behavior.  Denies any SI or HI.  States she has had history of this in the past.  [facility] who agrees patient has a history of self cutting behavior and is not an imminent threat to herself.  Symptoms most likely exacerbated as the patient is coming off of Suboxone.  Will give her dose of Suboxone here in the ER.  Patient stable and appropriate for follow-up with PCP.      ____________________________________________   FINAL CLINICAL IMPRESSION(S) / ED DIAGNOSES  Final diagnoses:  Deliberate self-cutting      NEW MEDICATIONS STARTED DURING THIS VISIT:  This SmartLink is deprecated. Use AVSMEDLIST instead to display the medication list for a patient.   Note:  This document was prepared using Dragon voice recognition software and may include unintentional dictation  errors.    Willy Eddyobinson, Danya Spearman, MD 02/02/17 2056

## 2017-02-02 NOTE — ED Notes (Signed)
[facility] in room for telepysch to talk to pt. Pt stated "no no no what is this?" explained to pt that psychiatrist is not in the hospital at this time and will have to be called for a skype type of call on the screen. Pt states "I only want to speak to Dr. Sheran FavaWeed. I will only talk to my psychaitrist." [facility] and evaluated. [facility]. Pt shouting "gimme my phone."

## 2017-02-02 NOTE — ED Notes (Addendum)
Pt states she cuts herself to make her feel better. New cut to L wrist noted. Blood noted on clothes. [place]. States she cuts herself all the time. States she has cuts on wrists and legs. Pt denies wanting to kill herself. Pt appears perky, smiling at Dr. Roxan Hockeyobinson. States tetanus a 2.5 years ago. Pt is alert, oriented, ambulatory. Not dressed out at this time. Boyfriend at bedside. Pt has cell phone. Pt states she deconstructed a razor blade to cut self today. Pt states she cut herself off of suboxone. Refusing a dose while in ED. States she doesn't want to take it anymore.

## 2017-02-02 NOTE — ED Notes (Signed)
[facility]

## 2017-02-02 NOTE — ED Notes (Addendum)
Pt came out of room stating that she was going to walk out stating "I just want to smoke a cigarette." Pt informed that she has to stay to talk to the psychiatrist. Dr. Roxan Hockeyobinson stated that if she did not cooperate that IVC papers would be taken out on her. Pt walked back into room. Husband sitting outside room.

## 2017-02-02 NOTE — ED Triage Notes (Signed)
Pt presents to ED with c/o "laceration" / abrasions to left wrist. Several abrasions noted. Bleeding slightly. Prior to showing this nurse her wounds pt states she "was not trying to kill herself". Pt cut herself with razor and states it was to "make me feel better". Pt states she stopped taking suboxone (has been prescribed for the past 8 years) a couple of days ago due to possible pregnancy. Pt states she does not want to stay here. Wants a prescription and a Band-Aid and to go home. When pt left triage to go to exam room pt husband states pt is very suicidal and went to the bathroom, locked the door, and cut her wrists. Pt husband reports pt is more suicidal at night and seems ok during the day; states he is very concerned about her safety.

## 2017-10-24 ENCOUNTER — Inpatient Hospital Stay
Admission: EM | Admit: 2017-10-24 | Discharge: 2017-10-28 | DRG: 854 | Discharge status: Against Medical Advice | Payer: Self-pay | Attending: Internal Medicine | Admitting: Internal Medicine

## 2017-10-24 ENCOUNTER — Emergency Department: Payer: Self-pay

## 2017-10-24 DIAGNOSIS — T401X1A Poisoning by heroin, accidental (unintentional), initial encounter: Secondary | ICD-10-CM | POA: Diagnosis present

## 2017-10-24 DIAGNOSIS — L02512 Cutaneous abscess of left hand: Secondary | ICD-10-CM | POA: Diagnosis present

## 2017-10-24 DIAGNOSIS — T40604A Poisoning by unspecified narcotics, undetermined, initial encounter: Secondary | ICD-10-CM

## 2017-10-24 DIAGNOSIS — F1310 Sedative, hypnotic or anxiolytic abuse, uncomplicated: Secondary | ICD-10-CM

## 2017-10-24 DIAGNOSIS — F1410 Cocaine abuse, uncomplicated: Secondary | ICD-10-CM | POA: Diagnosis present

## 2017-10-24 DIAGNOSIS — Z915 Personal history of self-harm: Secondary | ICD-10-CM

## 2017-10-24 DIAGNOSIS — A419 Sepsis, unspecified organism: Principal | ICD-10-CM | POA: Diagnosis present

## 2017-10-24 DIAGNOSIS — F4323 Adjustment disorder with mixed anxiety and depressed mood: Secondary | ICD-10-CM

## 2017-10-24 DIAGNOSIS — Z79899 Other long term (current) drug therapy: Secondary | ICD-10-CM

## 2017-10-24 DIAGNOSIS — F319 Bipolar disorder, unspecified: Secondary | ICD-10-CM | POA: Diagnosis present

## 2017-10-24 DIAGNOSIS — F1510 Other stimulant abuse, uncomplicated: Secondary | ICD-10-CM

## 2017-10-24 DIAGNOSIS — Z9119 Patient's noncompliance with other medical treatment and regimen: Secondary | ICD-10-CM

## 2017-10-24 DIAGNOSIS — L0390 Cellulitis, unspecified: Secondary | ICD-10-CM

## 2017-10-24 DIAGNOSIS — F17210 Nicotine dependence, cigarettes, uncomplicated: Secondary | ICD-10-CM | POA: Diagnosis present

## 2017-10-24 DIAGNOSIS — E871 Hypo-osmolality and hyponatremia: Secondary | ICD-10-CM | POA: Diagnosis present

## 2017-10-24 DIAGNOSIS — L03114 Cellulitis of left upper limb: Secondary | ICD-10-CM | POA: Diagnosis present

## 2017-10-24 DIAGNOSIS — R410 Disorientation, unspecified: Secondary | ICD-10-CM

## 2017-10-24 LAB — BASIC METABOLIC PANEL
Anion gap: 7 (ref 5–15)
BUN: 13 mg/dL (ref 6–20)
CALCIUM: 8.4 mg/dL — AB (ref 8.9–10.3)
CO2: 23 mmol/L (ref 22–32)
Chloride: 103 mmol/L (ref 98–111)
Creatinine, Ser: 0.57 mg/dL (ref 0.44–1.00)
GFR calc Af Amer: >60 mL/min (ref >60)
GFR calc non Af Amer: >60 mL/min (ref >60)
GLUCOSE: 107 mg/dL — AB (ref 70–99)
POTASSIUM: 3.8 mmol/L (ref 3.5–5.1)
Sodium: 133 mmol/L — ABNORMAL LOW (ref 135–145)

## 2017-10-24 LAB — CBC WITH DIFFERENTIAL/PLATELET
Basophils Absolute: 0 10*3/uL (ref 0–0.1)
Basophils Relative: 0 %
EOS PCT: 0 %
Eosinophils Absolute: 0 10*3/uL (ref 0–0.7)
HCT: 36.5 % (ref 35.0–47.0)
Hemoglobin: 13.1 g/dL (ref 12.0–16.0)
LYMPHS ABS: 0.5 10*3/uL — AB (ref 1.0–3.6)
LYMPHS PCT: 4 %
MCH: 32.4 pg (ref 26.0–34.0)
MCHC: 35.9 g/dL (ref 32.0–36.0)
MCV: 90.3 fL (ref 80.0–100.0)
MONO ABS: 1 10*3/uL — AB (ref 0.2–0.9)
Monocytes Relative: 8 %
Neutro Abs: 11.7 10*3/uL — ABNORMAL HIGH (ref 1.4–6.5)
Neutrophils Relative %: 88 %
PLATELETS: 243 10*3/uL (ref 150–440)
RBC: 4.04 MIL/uL (ref 3.80–5.20)
RDW: 12.6 % (ref 11.5–14.5)
WBC: 13.2 10*3/uL — AB (ref 3.6–11.0)

## 2017-10-24 LAB — HCG, QUANTITATIVE, PREGNANCY

## 2017-10-24 LAB — ETHANOL

## 2017-10-24 LAB — ACETAMINOPHEN LEVEL

## 2017-10-24 MED ORDER — VANCOMYCIN HCL IN DEXTROSE 1-5 GM/200ML-% IV SOLN
1000.0000 mg | Freq: Once | INTRAVENOUS | Status: DC
  Administered 2017-10-24: 1000 mg via INTRAVENOUS

## 2017-10-24 MED ORDER — VANCOMYCIN HCL IN DEXTROSE 1-5 GM/200ML-% IV SOLN
INTRAVENOUS | Status: AC
  Administered 2017-10-24: 1000 mg via INTRAVENOUS
  Filled 2017-10-24: qty 200

## 2017-10-24 MED ORDER — SODIUM CHLORIDE 0.9 % IV BOLUS
1000.0000 mL | Freq: Once | INTRAVENOUS | Status: AC
  Administered 2017-10-24: 1000 mL via INTRAVENOUS

## 2017-10-24 MED ORDER — DOXYCYCLINE HYCLATE 100 MG PO CAPS: 100.0000 mg | ORAL_CAPSULE | Freq: Two times a day (BID) | ORAL | 0 refills | Status: DC

## 2017-10-24 NOTE — ED Notes (Signed)
Pt admits to doing Herion, pt is responsive to limited verbal stimuli and painful stimuli. Pt live with mom that was bed bound. Pt lives at [facility] with mother Tonya Huff.

## 2017-10-24 NOTE — ED Triage Notes (Signed)
Pt is OD unresponsive. Pt is known IV drug user. PT is breathen Unlabored  narcan 2 IM 2 IV  20g L AC  136/76 98  133 98.3 Aux 136 CBG

## 2017-10-24 NOTE — ED Notes (Signed)
Dried blood seen on nostrils. MD Derrill KayGoodman informed.

## 2017-10-24 NOTE — ED Provider Notes (Signed)
[facility] Emergency Department Provider Note  ____________________________________________   I have reviewed the triage vital signs and the nursing notes.   HISTORY  Chief Complaint Drug Overdose   History limited by and level 5 caveat due to AMS   HPI Tonya Huff is a 32 y.o. female who presents to the emergency department today after overdose of narcotics. Patient was found to have decreased responsiveness. Was given narcan. Did have response to the narcan. Cannot give any history here.    Per medical record review patient has a history of sepsis, SI, bipolar.   Past Medical History:  Diagnosis Date  . Anxiety   . Bipolar 1 disorder ([name])   . Diverticulitis   . Kidney infection   . PID (pelvic inflammatory disease)     Patient Active Problem List   Diagnosis Date Noted  . Sepsis ([name]) 10/30/2016  . Tobacco use disorder 10/08/2016  . Opioid use disorder, mild, in early remission, on maintenance therapy, abuse ([name]) 10/08/2016  . Alcohol use disorder, moderate, dependence ([name]) 10/07/2016  . Cocaine use disorder, moderate, dependence ([name]) 10/07/2016  . 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 Suicidal ideation 10/07/2016  . Bipolar I disorder, most recent episode depressed, severe without psychotic features ([name]) 10/07/2016    History reviewed. No pertinent surgical history.  Prior to Admission medications   Medication Sig Start Date End Date Taking? Authorizing Provider  gabapentin (NEURONTIN) 300 MG capsule Take 3 capsules (900 mg total) by mouth 3 (three) times daily. 10/09/16   Pucilowska, Braulio ConteJolanta B, MD  lithium carbonate 300 MG capsule Take 1 capsule (300 mg total) by mouth 3 (three) times daily with meals. 10/09/16   Pucilowska, Braulio ConteJolanta B, MD  traZODone (DESYREL) 150 MG tablet Take 1 tablet (150 mg total) by mouth at bedtime. 10/09/16   Pucilowska, Ellin GoodieJolanta B, MD    Allergies Haldol [haloperidol]  No family history on  file.  Social History Social History   Tobacco Use  . Smoking status: Current Every Day Smoker    Packs/day: 2.00    Years: 10.00    Pack years: 20.00    Types: Cigarettes  . Smokeless tobacco: Never Used  . Tobacco comment: Pt would like the nicotine gum  Substance Use Topics  . Alcohol use: Yes    Alcohol/week: 10.0 standard drinks    Types: 10 Shots of liquor per week  . Drug use: Yes    Types: Marijuana, Cocaine, IV    Comment: Marijuana twice a month, cocaine one time    Review of Systems Unable to obtain given altered mental status.  ____________________________________________   PHYSICAL EXAM:  VITAL SIGNS: ED Triage Vitals [10/24/17 1900]  Enc Vitals Group     BP 133/68     Pulse      Resp      Temp 98.3 F (36.8 C)     Temp Source Oral     SpO2 100 %     Weight 130 lb 1.1 oz (59 kg)     Height 5\' 2"  (1.575 m)     Head Circumference      Peak Flow      Pain Score 0   Constitutional: Somnolent, awakens to painful stimuli. Eyes: Conjunctivae are normal.  ENT      Head: Normocephalic and atraumatic.      Nose: No congestion/rhinnorhea.      Mouth/Throat: Mucous membranes are moist.      Neck: No stridor. Hematological/Lymphatic/Immunilogical: No cervical lymphadenopathy.  Cardiovascular: Normal rate, regular rhythm.  No murmurs, rubs, or gallops.  Respiratory: Normal respiratory effort without tachypnea nor retractions. Breath sounds are clear and equal bilaterally. No wheezes/rales/rhonchi. Gastrointestinal: Soft and non tender. No rebound. No guarding.  Genitourinary: Deferred Musculoskeletal: Normal range of motion in all extremities. No lower extremity edema. Neurologic:  Somnolent awakens to painful stimuli.  Skin:  Redness and swelling to left dorsal hand. Psychiatric: Mood and affect are normal. Speech and behavior are normal. Patient exhibits appropriate insight and judgment.  ____________________________________________    LABS (pertinent  positives/negatives)  hcg <1 CBC wbc 13.2, hgb 13.1, plt 243 BMP na 133, k 3.8, cr 0.57  ____________________________________________   EKG  None  ____________________________________________    RADIOLOGY  CT head/max face/c spine No acute osseous injury, no intracranial bleed  ____________________________________________   PROCEDURES  Procedures  ____________________________________________   INITIAL IMPRESSION / ASSESSMENT AND PLAN / ED COURSE  Pertinent labs & imaging results that were available during my care of the patient were reviewed by me and considered in my medical decision making (see chart for details).   Patient presented to the emergency department today after narcotic overdose.  Patient was given Narcan in the field.  On exam here patient does respond to painful stimuli and is protecting her airway.  She was noted to have some redness around the dorsal part of her left hand.  Certainly consistent with cellulitis.  Blood work shows a mild leukocytosis.  Patient will be given antibiotics.  Per chart review patient has a history of suicidal ideation and intentional overdose in the past.  Will place patient under IVC.  ________________________________________   FINAL CLINICAL IMPRESSION(S) / ED DIAGNOSES  Final diagnoses:  Narcotic overdose, undetermined intent, initial encounter ([name])  Cellulitis of left upper extremity     Note: This dictation was prepared with Dragon dictation. Any transcriptional errors that result from this process are unintentional     Phineas Semen, MD 10/24/17 [phone]

## 2017-10-24 NOTE — ED Notes (Signed)
Patient transported to CT 

## 2017-10-24 NOTE — ED Notes (Addendum)
Patient sleeping

## 2017-10-25 ENCOUNTER — Other Ambulatory Visit: Payer: Self-pay

## 2017-10-25 ENCOUNTER — Encounter: Payer: Self-pay | Admitting: Student

## 2017-10-25 ENCOUNTER — Inpatient Hospital Stay: Payer: Self-pay

## 2017-10-25 DIAGNOSIS — R410 Disorientation, unspecified: Secondary | ICD-10-CM

## 2017-10-25 DIAGNOSIS — F1310 Sedative, hypnotic or anxiolytic abuse, uncomplicated: Secondary | ICD-10-CM

## 2017-10-25 DIAGNOSIS — F1510 Other stimulant abuse, uncomplicated: Secondary | ICD-10-CM

## 2017-10-25 DIAGNOSIS — F4323 Adjustment disorder with mixed anxiety and depressed mood: Secondary | ICD-10-CM

## 2017-10-25 DIAGNOSIS — L0390 Cellulitis, unspecified: Secondary | ICD-10-CM

## 2017-10-25 LAB — URINALYSIS, ROUTINE W REFLEX MICROSCOPIC
Bacteria, UA: NONE SEEN
Bilirubin Urine: NEGATIVE
Glucose, UA: NEGATIVE mg/dL
Hgb urine dipstick: NEGATIVE
KETONES UR: 80 mg/dL — AB
Leukocytes, UA: NEGATIVE
Nitrite: NEGATIVE
PH: 6 (ref 5.0–8.0)
Protein, ur: 30 mg/dL — AB
SPECIFIC GRAVITY, URINE: 1.023 (ref 1.005–1.030)

## 2017-10-25 LAB — CREATININE, SERUM
Creatinine, Ser: 0.61 mg/dL (ref 0.44–1.00)
GFR calc non Af Amer: >60 mL/min (ref >60)

## 2017-10-25 LAB — CBC
HEMATOCRIT: 35.4 % (ref 35.0–47.0)
Hemoglobin: 12.2 g/dL (ref 12.0–16.0)
MCH: 32.3 pg (ref 26.0–34.0)
MCHC: 34.4 g/dL (ref 32.0–36.0)
MCV: 94 fL (ref 80.0–100.0)
PLATELETS: 204 10*3/uL (ref 150–440)
RBC: 3.77 MIL/uL — ABNORMAL LOW (ref 3.80–5.20)
RDW: 12.9 % (ref 11.5–14.5)
WBC: 9.9 10*3/uL (ref 3.6–11.0)

## 2017-10-25 LAB — LACTIC ACID, PLASMA
LACTIC ACID, VENOUS: 3.5 mmol/L — AB (ref 0.5–1.9)
LACTIC ACID, VENOUS: 1.5 mmol/L (ref 0.5–1.9)

## 2017-10-25 LAB — URINE DRUG SCREEN, QUALITATIVE (ARMC ONLY)
AMPHETAMINES, UR SCREEN: POSITIVE — AB
BARBITURATES, UR SCREEN: NOT DETECTED
BENZODIAZEPINE, UR SCRN: POSITIVE — AB
COCAINE METABOLITE, UR ~~LOC~~: NOT DETECTED
Cannabinoid 50 Ng, Ur ~~LOC~~: POSITIVE — AB
MDMA (Ecstasy)Ur Screen: NOT DETECTED
Methadone Scn, Ur: NOT DETECTED
Opiate, Ur Screen: NOT DETECTED
Phencyclidine (PCP) Ur S: NOT DETECTED
TRICYCLIC, UR SCREEN: NOT DETECTED

## 2017-10-25 LAB — SALICYLATE LEVEL: Salicylate Lvl: <7 mg/dL (ref 2.8–30.0)

## 2017-10-25 MED ORDER — SODIUM CHLORIDE 0.9 % IV BOLUS
1000.0000 mL | Freq: Once | INTRAVENOUS | Status: AC
  Administered 2017-10-25: 1000 mL via INTRAVENOUS

## 2017-10-25 MED ORDER — ONDANSETRON HCL 4 MG PO TABS: 4.0000 mg | ORAL_TABLET | Freq: Four times a day (QID) | ORAL | Status: DC | PRN

## 2017-10-25 MED ORDER — KETOROLAC TROMETHAMINE 30 MG/ML IJ SOLN
INTRAMUSCULAR | Status: AC
  Filled 2017-10-25: qty 1

## 2017-10-25 MED ORDER — HYDROMORPHONE HCL 1 MG/ML IJ SOLN
1.0000 mg | Freq: Once | INTRAMUSCULAR | Status: AC
  Administered 2017-10-25: 1 mg via INTRAVENOUS
  Filled 2017-10-25: qty 1

## 2017-10-25 MED ORDER — CLINDAMYCIN PHOSPHATE 600 MG/50ML IV SOLN
600.0000 mg | Freq: Once | INTRAVENOUS | Status: AC
  Administered 2017-10-25: 600 mg via INTRAVENOUS
  Filled 2017-10-25: qty 50

## 2017-10-25 MED ORDER — NICOTINE 21 MG/24HR TD PT24
21.0000 mg | MEDICATED_PATCH | Freq: Every day | TRANSDERMAL | Status: DC
  Administered 2017-10-25 – 2017-10-27 (×3): 21 mg via TRANSDERMAL
  Filled 2017-10-25 (×4): qty 1

## 2017-10-25 MED ORDER — TRAZODONE HCL 50 MG PO TABS
100.0000 mg | ORAL_TABLET | Freq: Every day | ORAL | Status: DC
  Administered 2017-10-25: 22:00:00 100 mg via ORAL
  Filled 2017-10-25: qty 2

## 2017-10-25 MED ORDER — KETOROLAC TROMETHAMINE 30 MG/ML IJ SOLN
15.0000 mg | Freq: Once | INTRAMUSCULAR | Status: AC
  Administered 2017-10-25: 15 mg via INTRAVENOUS

## 2017-10-25 MED ORDER — LORAZEPAM 2 MG/ML IJ SOLN
1.0000 mg | Freq: Once | INTRAMUSCULAR | Status: AC
  Administered 2017-10-25: 1 mg via INTRAVENOUS
  Filled 2017-10-25: qty 1

## 2017-10-25 MED ORDER — NICOTINE POLACRILEX 2 MG MT GUM
2.0000 mg | CHEWING_GUM | OROMUCOSAL | Status: DC | PRN
  Administered 2017-10-26 – 2017-10-27 (×6): 2 mg via ORAL
  Filled 2017-10-25 (×10): qty 1

## 2017-10-25 MED ORDER — ONDANSETRON HCL 4 MG/2ML IJ SOLN: 4.0000 mg | Freq: Four times a day (QID) | INTRAMUSCULAR | Status: DC | PRN

## 2017-10-25 MED ORDER — ZIPRASIDONE MESYLATE 20 MG IM SOLR
20.0000 mg | Freq: Once | INTRAMUSCULAR | Status: DC
  Filled 2017-10-25: qty 20

## 2017-10-25 MED ORDER — SULFAMETHOXAZOLE-TRIMETHOPRIM 800-160 MG PO TABS
1.0000 | ORAL_TABLET | Freq: Two times a day (BID) | ORAL | Status: DC
  Administered 2017-10-25 (×2): 1 via ORAL
  Filled 2017-10-25: qty 1

## 2017-10-25 MED ORDER — POLYETHYLENE GLYCOL 3350 17 G PO PACK
17.0000 g | PACK | Freq: Every day | ORAL | Status: DC | PRN
  Administered 2017-10-26: 17 g via ORAL
  Filled 2017-10-25: qty 1

## 2017-10-25 MED ORDER — DIPHENHYDRAMINE HCL 50 MG/ML IJ SOLN
50.0000 mg | Freq: Once | INTRAMUSCULAR | Status: AC
  Administered 2017-10-25: 50 mg via INTRAVENOUS
  Filled 2017-10-25: qty 1

## 2017-10-25 MED ORDER — HYDROCODONE-ACETAMINOPHEN 5-325 MG PO TABS
1.0000 | ORAL_TABLET | ORAL | Status: DC | PRN
  Administered 2017-10-25 (×2): 2 via ORAL
  Administered 2017-10-26: 02:00:00 1 via ORAL
  Administered 2017-10-26: 2 via ORAL
  Filled 2017-10-25 (×5): qty 2

## 2017-10-25 MED ORDER — CEPHALEXIN 500 MG PO CAPS
500.0000 mg | ORAL_CAPSULE | Freq: Once | ORAL | Status: DC
  Filled 2017-10-25: qty 1

## 2017-10-25 MED ORDER — HYDROMORPHONE HCL 1 MG/ML IJ SOLN
0.5000 mg | INTRAMUSCULAR | Status: DC | PRN
  Administered 2017-10-25 – 2017-10-26 (×4): 0.5 mg via INTRAVENOUS
  Filled 2017-10-25 (×4): qty 1

## 2017-10-25 MED ORDER — CEPHALEXIN 500 MG PO CAPS
500.0000 mg | ORAL_CAPSULE | Freq: Four times a day (QID) | ORAL | Status: DC
  Administered 2017-10-25 (×3): 500 mg via ORAL
  Filled 2017-10-25 (×2): qty 1

## 2017-10-25 MED ORDER — ENOXAPARIN SODIUM 40 MG/0.4ML ~~LOC~~ SOLN
40.0000 mg | SUBCUTANEOUS | Status: DC
  Administered 2017-10-25 – 2017-10-26 (×2): 40 mg via SUBCUTANEOUS
  Filled 2017-10-25 (×3): qty 0.4

## 2017-10-25 MED ORDER — MORPHINE SULFATE (PF) 2 MG/ML IV SOLN
2.0000 mg | INTRAVENOUS | Status: DC | PRN
  Filled 2017-10-25: qty 1

## 2017-10-25 MED ORDER — HALOPERIDOL LACTATE 5 MG/ML IJ SOLN
5.0000 mg | Freq: Once | INTRAMUSCULAR | Status: DC
  Filled 2017-10-25: qty 1

## 2017-10-25 MED ORDER — LORAZEPAM 2 MG/ML IJ SOLN
2.0000 mg | Freq: Once | INTRAMUSCULAR | Status: AC
  Administered 2017-10-25: 2 mg via INTRAVENOUS
  Filled 2017-10-25: qty 1

## 2017-10-25 MED ORDER — TEMAZEPAM 15 MG PO CAPS
15.0000 mg | ORAL_CAPSULE | Freq: Every day | ORAL | Status: DC
  Administered 2017-10-25: 22:00:00 15 mg via ORAL
  Filled 2017-10-25: qty 1

## 2017-10-25 MED ORDER — ACETAMINOPHEN 325 MG PO TABS
650.0000 mg | ORAL_TABLET | Freq: Four times a day (QID) | ORAL | Status: DC | PRN
  Administered 2017-10-26: 650 mg via ORAL
  Filled 2017-10-25: qty 2

## 2017-10-25 MED ORDER — SULFAMETHOXAZOLE-TRIMETHOPRIM 800-160 MG PO TABS: 1.0000 | ORAL_TABLET | Freq: Two times a day (BID) | ORAL | 0 refills | Status: DC

## 2017-10-25 MED ORDER — ACETAMINOPHEN 650 MG RE SUPP: 650.0000 mg | Freq: Four times a day (QID) | RECTAL | Status: DC | PRN

## 2017-10-25 MED ORDER — BISACODYL 5 MG PO TBEC
5.0000 mg | DELAYED_RELEASE_TABLET | Freq: Every day | ORAL | Status: DC | PRN
  Administered 2017-10-26 (×2): 5 mg via ORAL
  Filled 2017-10-25 (×2): qty 1

## 2017-10-25 MED ORDER — VANCOMYCIN HCL IN DEXTROSE 750-5 MG/150ML-% IV SOLN
750.0000 mg | Freq: Two times a day (BID) | INTRAVENOUS | Status: DC
  Administered 2017-10-26 – 2017-10-27 (×4): 750 mg via INTRAVENOUS
  Filled 2017-10-25 (×6): qty 150

## 2017-10-25 MED ORDER — SULFAMETHOXAZOLE-TRIMETHOPRIM 800-160 MG PO TABS
1.0000 | ORAL_TABLET | Freq: Once | ORAL | Status: AC
  Administered 2017-10-25: 1 via ORAL
  Filled 2017-10-25: qty 1

## 2017-10-25 MED ORDER — SODIUM CHLORIDE 0.9 % IV SOLN
INTRAVENOUS | Status: DC
  Administered 2017-10-25 – 2017-10-26 (×2): via INTRAVENOUS

## 2017-10-25 MED ORDER — CEPHALEXIN 500 MG PO CAPS: 500.0000 mg | ORAL_CAPSULE | Freq: Four times a day (QID) | ORAL | 0 refills | Status: DC

## 2017-10-25 MED ORDER — VANCOMYCIN HCL IN DEXTROSE 1-5 GM/200ML-% IV SOLN
1000.0000 mg | Freq: Once | INTRAVENOUS | Status: AC
  Administered 2017-10-25: 1000 mg via INTRAVENOUS
  Filled 2017-10-25: qty 200

## 2017-10-25 NOTE — H&P (Signed)
[facility] [place] at [facility]   PATIENT NAME: Tonya Huff    MR#:  [id number]  DATE OF BIRTH:  [birth year]  DATE OF ADMISSION:  10/24/2017  PRIMARY CARE PHYSICIAN: Patient, No Pcp Per   REQUESTING/REFERRING PHYSICIAN: dr Sharma Covert  CHIEF COMPLAINT:   cellulitis HISTORY OF PRESENT ILLNESS:  Tonya Huff  is a 32 y.o. female with a known history of heroin abuse, tobacco dependence, bipolar disorder who presents to the emergency room after heroin overdose. Blood service was contacted due to hand cellulitis which has worsened over the course of her hospital stay while in the emergency room.  Patient has involuntary commitment papers filled out by ER physician. Patient reports that she did not inject any drugs into the cellulitic hand.  Patient was evaluated by Dr. Hyacinth Meeker while in the emergency room due to the hand cellulitis who agrees to follow patient while the patient is in the hospital.   PAST MEDICAL HISTORY:   Past Medical History:  Diagnosis Date  . Anxiety   . Bipolar 1 disorder ([name])   . Diverticulitis   . Kidney infection   . PID (pelvic inflammatory disease)     PAST SURGICAL HISTORY:  History reviewed. No pertinent surgical history.  SOCIAL HISTORY:   Social History   Tobacco Use  . Smoking status: Current Every Day Smoker    Packs/day: 2.00    Years: 10.00    Pack years: 20.00    Types: Cigarettes  . Smokeless tobacco: Never Used  . Tobacco comment: Pt would like the nicotine gum  Substance Use Topics  . Alcohol use: Yes    Alcohol/week: 10.0 standard drinks    Types: 10 Shots of liquor per week    FAMILY HISTORY:  No family history on file.  DRUG ALLERGIES:  No Active Allergies  REVIEW OF SYSTEMS:   Review of Systems  Constitutional: Negative.  Negative for chills, fever and malaise/fatigue.  HENT: Negative.  Negative for ear discharge, ear pain, hearing loss, nosebleeds and sore throat.   Eyes: Negative.  Negative  for blurred vision and pain.  Respiratory: Negative.  Negative for cough, hemoptysis, shortness of breath and wheezing.   Cardiovascular: Negative.  Negative for chest pain, palpitations and leg swelling.  Gastrointestinal: Negative.  Negative for abdominal pain, blood in stool, diarrhea, nausea and vomiting.  Genitourinary: Negative.  Negative for dysuria.  Musculoskeletal: Negative.  Negative for back pain.  Skin: Negative.        Left hand swollen tender joint line tenderness  Neurological: Negative for dizziness, tremors, speech change, focal weakness, seizures and headaches.  Endo/Heme/Allergies: Negative.  Does not bruise/bleed easily.  Psychiatric/Behavioral: Positive for depression and substance abuse. Negative for hallucinations and suicidal ideas.    MEDICATIONS AT HOME:   Prior to Admission medications   Medication Sig Start Date End Date Taking? Authorizing Provider  cephALEXin (KEFLEX) 500 MG capsule Take 1 capsule (500 mg total) by mouth 4 (four) times daily for 10 days. 10/25/17 11/04/17  Merrily Brittle, MD  gabapentin (NEURONTIN) 300 MG capsule Take 3 capsules (900 mg total) by mouth 3 (three) times daily. Patient not taking: Reported on 10/25/2017 10/09/16   Pucilowska, Braulio Conte B, MD  lithium carbonate 300 MG capsule Take 1 capsule (300 mg total) by mouth 3 (three) times daily with meals. Patient not taking: Reported on 10/25/2017 10/09/16   Pucilowska, Ellin Goodie, MD  sulfamethoxazole-trimethoprim (BACTRIM DS,SEPTRA DS) 800-160 MG tablet Take 1 tablet by mouth 2 (two) times daily.  10/25/17   Merrily Brittle, MD  traZODone (DESYREL) 150 MG tablet Take 1 tablet (150 mg total) by mouth at bedtime. Patient not taking: Reported on 10/25/2017 10/09/16   Pucilowska, Ellin Goodie, MD      VITAL SIGNS:  Blood pressure (!) 113/57, pulse (!) 120, temperature 98 F (36.7 C), temperature source Oral, resp. rate 16, height 5\' 2"  (1.575 m), weight 59 kg, SpO2 100 %.  PHYSICAL EXAMINATION:    Physical Exam  Constitutional: She is oriented to person, place, and time. No distress.  HENT:  Head: Normocephalic.  Eyes: No scleral icterus.  Neck: Normal range of motion. Neck supple. No JVD present. No tracheal deviation present.  Cardiovascular: Normal rate, regular rhythm and normal heart sounds. Exam reveals no gallop and no friction rub.  No murmur heard. Pulmonary/Chest: Effort normal and breath sounds normal. No respiratory distress. She has no wheezes. She has no rales. She exhibits no tenderness.  Abdominal: Soft. Bowel sounds are normal. She exhibits no distension and no mass. There is no tenderness. There is no rebound and no guarding.  Musculoskeletal: Normal range of motion. She exhibits no edema.  Neurological: She is alert and oriented to person, place, and time.  Skin: Skin is warm. No rash noted. No erythema.  Left hand swollen very tender to touch erythematous unable to extend and flex fingers. No obvious source of open wound  Psychiatric: Judgment normal.      LABORATORY PANEL:   CBC Recent Labs  Lab 10/24/17 1907  WBC 13.2*  HGB 13.1  HCT 36.5  PLT 243   ------------------------------------------------------------------------------------------------------------------  Chemistries  Recent Labs  Lab 10/24/17 1907  NA 133*  K 3.8  CL 103  CO2 23  GLUCOSE 107*  BUN 13  CREATININE 0.57  CALCIUM 8.4*   ------------------------------------------------------------------------------------------------------------------  Cardiac Enzymes No results for input(s): TROPONINI in the last 168 hours. ------------------------------------------------------------------------------------------------------------------  RADIOLOGY:  Ct Head Wo Contrast  Result Date: 10/24/2017 CLINICAL DATA:  Overdose patient.  Unresponsive. EXAM: CT HEAD WITHOUT CONTRAST CT MAXILLOFACIAL WITHOUT CONTRAST CT CERVICAL SPINE WITHOUT CONTRAST TECHNIQUE: Multidetector CT imaging  of the head, cervical spine, and maxillofacial structures were performed using the standard protocol without intravenous contrast. Multiplanar CT image reconstructions of the cervical spine and maxillofacial structures were also generated. COMPARISON:  CT 04/28/2017 FINDINGS: CT HEAD FINDINGS Brain: The brain shows a normal appearance without evidence of malformation, atrophy, old or acute small or large vessel infarction, mass lesion, hemorrhage, hydrocephalus or extra-axial collection. Vascular: No hyperdense vessel. No evidence of atherosclerotic calcification. Skull: Normal.  No traumatic finding.  No focal bone lesion. Sinuses/Orbits: Sinuses are clear. Orbits appear normal. Mastoids are clear. Other: None significant CT MAXILLOFACIAL FINDINGS Osseous: No facial fracture or focal lesion. Orbits: Normal Sinuses: Mucosal inflammation of the maxillary sinuses right more than left. Other sinuses clear. Soft tissues: Soft tissues of the face otherwise negative. CT CERVICAL SPINE FINDINGS Alignment: No traumatic malalignment. Straightening of the normal cervical lordosis. Skull base and vertebrae: Negative.  No traumatic finding. Soft tissues and spinal canal: Negative Disc levels: Normal except for ordinary spondylosis at C5-6 with small endplate osteophytes. No significant bony narrowing of the canal or foramina. Upper chest: Negative Other: None IMPRESSION: Head CT: Normal. Facial CT: Normal except for mild mucosal inflammatory changes of the maxillary sinuses right more than left. Cervical spine CT: Negative except for mild, probably subclinical spondylosis at C5-6. Electronically Signed   By: Paulina Fusi M.D.   On: 10/24/2017 21:17  Ct Cervical Spine Wo Contrast  Result Date: 10/24/2017 CLINICAL DATA:  Overdose patient.  Unresponsive. EXAM: CT HEAD WITHOUT CONTRAST CT MAXILLOFACIAL WITHOUT CONTRAST CT CERVICAL SPINE WITHOUT CONTRAST TECHNIQUE: Multidetector CT imaging of the head, cervical spine, and  maxillofacial structures were performed using the standard protocol without intravenous contrast. Multiplanar CT image reconstructions of the cervical spine and maxillofacial structures were also generated. COMPARISON:  CT 04/28/2017 FINDINGS: CT HEAD FINDINGS Brain: The brain shows a normal appearance without evidence of malformation, atrophy, old or acute small or large vessel infarction, mass lesion, hemorrhage, hydrocephalus or extra-axial collection. Vascular: No hyperdense vessel. No evidence of atherosclerotic calcification. Skull: Normal.  No traumatic finding.  No focal bone lesion. Sinuses/Orbits: Sinuses are clear. Orbits appear normal. Mastoids are clear. Other: None significant CT MAXILLOFACIAL FINDINGS Osseous: No facial fracture or focal lesion. Orbits: Normal Sinuses: Mucosal inflammation of the maxillary sinuses right more than left. Other sinuses clear. Soft tissues: Soft tissues of the face otherwise negative. CT CERVICAL SPINE FINDINGS Alignment: No traumatic malalignment. Straightening of the normal cervical lordosis. Skull base and vertebrae: Negative.  No traumatic finding. Soft tissues and spinal canal: Negative Disc levels: Normal except for ordinary spondylosis at C5-6 with small endplate osteophytes. No significant bony narrowing of the canal or foramina. Upper chest: Negative Other: None IMPRESSION: Head CT: Normal. Facial CT: Normal except for mild mucosal inflammatory changes of the maxillary sinuses right more than left. Cervical spine CT: Negative except for mild, probably subclinical spondylosis at C5-6. Electronically Signed   By: Paulina Fusi M.D.   On: 10/24/2017 21:17   Ct Maxillofacial Wo Contrast  Result Date: 10/24/2017 CLINICAL DATA:  Overdose patient.  Unresponsive. EXAM: CT HEAD WITHOUT CONTRAST CT MAXILLOFACIAL WITHOUT CONTRAST CT CERVICAL SPINE WITHOUT CONTRAST TECHNIQUE: Multidetector CT imaging of the head, cervical spine, and maxillofacial structures were performed  using the standard protocol without intravenous contrast. Multiplanar CT image reconstructions of the cervical spine and maxillofacial structures were also generated. COMPARISON:  CT 04/28/2017 FINDINGS: CT HEAD FINDINGS Brain: The brain shows a normal appearance without evidence of malformation, atrophy, old or acute small or large vessel infarction, mass lesion, hemorrhage, hydrocephalus or extra-axial collection. Vascular: No hyperdense vessel. No evidence of atherosclerotic calcification. Skull: Normal.  No traumatic finding.  No focal bone lesion. Sinuses/Orbits: Sinuses are clear. Orbits appear normal. Mastoids are clear. Other: None significant CT MAXILLOFACIAL FINDINGS Osseous: No facial fracture or focal lesion. Orbits: Normal Sinuses: Mucosal inflammation of the maxillary sinuses right more than left. Other sinuses clear. Soft tissues: Soft tissues of the face otherwise negative. CT CERVICAL SPINE FINDINGS Alignment: No traumatic malalignment. Straightening of the normal cervical lordosis. Skull base and vertebrae: Negative.  No traumatic finding. Soft tissues and spinal canal: Negative Disc levels: Normal except for ordinary spondylosis at C5-6 with small endplate osteophytes. No significant bony narrowing of the canal or foramina. Upper chest: Negative Other: None IMPRESSION: Head CT: Normal. Facial CT: Normal except for mild mucosal inflammatory changes of the maxillary sinuses right more than left. Cervical spine CT: Negative except for mild, probably subclinical spondylosis at C5-6. Electronically Signed   By: Paulina Fusi M.D.   On: 10/24/2017 21:17    EKG:   Orders placed or performed during the hospital encounter of 10/30/16  . ED EKG  . ED EKG  . EKG 12-Lead  . EKG 12-Lead    IMPRESSION AND PLAN:    32 year old female with tobacco dependence, heroin abuse and bipolar who initially presented to the  emergency room after overdose on heroin and now has a left hand cellulitis.  1.   Sepsis: Patient now has cytosis, tachycardia and tachypnea Sepsis due to left hand cellulitis and possible abscess I asked ER MD to order MRI to evaluate for abscess Patient evaluated by Dr. Hyacinth MeekerMiller while in the emergency room Elevate hand Follow-up blood cultures Vancomycin for cellulitis Follow lactic acid level 2.  Left hand cellulitis: Continue vancomycin Orthopedic to follow Follow-up on MRI  3.  Substance abuse heroin overdose: Patient has involuntary commitment papers filled Psychiatry evaluation Bedside sitter Toxicology positive for amphetamines, benzodiazepines and marijuana   4.  Hyponatremia from sepsis: Continue IV fluids and repeat BMP in a.m. 5.Tobacco dependence: Patient is encouraged to quit smoking. Counseling was provided for 4 minutes. Nicotine patch and nicotine gum ordered as per patient's request     All the records are reviewed and case discussed with ED provider. Management plans discussed with the patient and she in agreement  CODE STATUS: full  TOTAL TIME TAKING CARE OF THIS PATIENT: 50 minutes.    Laquinn Shippy M.D on 10/25/2017 at 10:48 AM  Between 7am to 6pm - Pager - [phone]  After 6pm go to [email or web address] - password [email or web address]  [facility]  Office  [phone]  CC: Primary care physician; Patient, No Pcp Per

## 2017-10-25 NOTE — ED Provider Notes (Addendum)
This patient was signed out to me by Dr. Merrily BrittleNeil Rifenbark.  32 year old female with a heroin overdose as a suicide attempt, whose clinical course is complicated by left hand cellulitis.  Since arrival, the patient's left hand cellulitis has become significantly worse and she has worsening erythema and swelling beyond the initial demarcation line despite Keflex and Bactrim.  At this time, the patient does meet criteria for sepsis with tachycardia and elevated white blood cell count.  Blood cultures and a lactic acid will be drawn and the patient will receive immediate IV clindamycin, with instructions to elevate and ice her arm.  The patient will be admitted to the hospitalist for further evaluation and treatment.  ----------------------------------------- 10:22 AM on 10/25/2017 -----------------------------------------  The patient has received IV antibiotics and has been seen by the admitting hospitalist.  I have spoke with Dr. Clydia LlanoHoweard Miller, who recommends elevation with splint placement and continued treatment.  An MRI of the hand is pending to rule out abscess or other complicating factors.  Plan reevaluation for final disposition.  ----------------------------------------- 3:03 PM on 10/25/2017 -----------------------------------------  The patient was unable to tolerate MRI with first attempt, and was admitted upstairs with MRI planned for this afternoon.   Rockne MenghiniNorman, Anne-Caroline, MD 10/25/17 [id number]    Rockne MenghiniNorman, Anne-Caroline, MD 10/25/17 1504

## 2017-10-25 NOTE — ED Notes (Signed)
Patinet changed into hospital provided scrubs by this RN and Enis Slipperkadeshia NT. Patient's belonging's include:  3 metal rings, 2 shirts, both cut off of patient, 1 pair socks, 1 pair black pants, 2 black hair ties, 1 brown hair band, pair panties, pair black pants

## 2017-10-25 NOTE — ED Notes (Signed)
Patient is loud; yelling; uncooperative; and demanding.

## 2017-10-25 NOTE — ED Notes (Signed)
Lab at bedside to draw blood.

## 2017-10-25 NOTE — ED Provider Notes (Signed)
Patient was placed under involuntary commitment by Dr. Derrill KayGoodman.  She does appear to have cellulitis to her left hand and he had initially ordered vancomycin however as she very well will likely be outpatient or go to a psychiatric facility that is unable to care for IVs and vancomycin does not begin working until his trough is high enough transitioning her to oral medication instead Bactrim and Keflex as there are noninferior to IVs for cellulitis.  The patient continues to scream, be agitated, and largely uncooperative.  IM Geodon will be given for the patient's own safety as well as IV Ativan and IV Benadryl.   Merrily Brittleifenbark, Quentavious Rittenhouse, MD 10/25/17 [phone]

## 2017-10-25 NOTE — ED Notes (Addendum)
Pt crying and saying that nothing is helping her pain. She states that we are not helping her. Pt reminded that we have given her medication and are trying to help her feel better. Counseled to try deep breathing, meditation, visualization.

## 2017-10-25 NOTE — Progress Notes (Signed)
CODE SEPSIS - PHARMACY COMMUNICATION  **Broad Spectrum Antibiotics should be administered within 1 hour of Sepsis diagnosis**  Time Code Sepsis Called/Page Received: [id number]  Antibiotics Ordered: clindamycin  Time of 1st antibiotic administration: 1010 but pt was given po keflex and bactrim prior to the code being called  Additional action taken by pharmacy:   If necessary, Name of Provider/Nurse Contacted:     Olene FlossMelissa D Chrysten Woulfe ,PharmD, BCPS Clinical Pharmacist  10/25/2017  1:40 PM

## 2017-10-25 NOTE — ED Notes (Signed)
Area of redness on left arm has increased in size since arrive to this ED. MD Dolores FrameSung informed.

## 2017-10-25 NOTE — Consult Note (Signed)
ORTHOPAEDIC CONSULTATION  REQUESTING PHYSICIAN: Adrian Saran, MD  Chief Complaint: Left hand pain and swelling  HPI: Tonya Huff is a 32 y.o. female who complains of left hand pain and swelling for the last 24 to 36 hours.  Patient has a history of IV heroin injection apparently.  She denies anything recent but apparently had an overdose yesterday and Narcan was administered to recover her.  She is under an involuntary commitment.  Developed pain and swelling in the left hand yesterday.  This apparently worsened overnight somewhat on oral antibiotics.  White blood count was slightly elevated yesterday evening but is normal now.  She has been afebrile.  Been admitted for IV antibiotics  An MRI was ordered this afternoon but apparently has not been done.  Dr. Fredia Beets note indicated that the patient will be started on IV vancomycin but these orders are not in the chart.  Past Medical History:  Diagnosis Date  . Anxiety   . Bipolar 1 disorder ([name])   . Diverticulitis   . Kidney infection   . PID (pelvic inflammatory disease)    History reviewed. No pertinent surgical history. Social History   Socioeconomic History  . Marital status: Single    Spouse name: Not on file  . Number of children: Not on file  . Years of education: Not on file  . Highest education level: Not on file  Occupational History  . Not on file  Social Needs  . Financial resource strain: Not on file  . Food insecurity:    Worry: Not on file    Inability: Not on file  . Transportation needs:    Medical: Not on file    Non-medical: Not on file  Tobacco Use  . Smoking status: Current Every Day Smoker    Packs/day: 2.00    Years: 10.00    Pack years: 20.00    Types: Cigarettes  . Smokeless tobacco: Never Used  . Tobacco comment: Pt would like the nicotine gum  Substance and Sexual Activity  . Alcohol use: Yes    Alcohol/week: 10.0 standard drinks    Types: 10 Shots of liquor per week  . Drug use: Yes     Types: Marijuana, Cocaine, IV    Comment: Marijuana twice a month, cocaine one time  . Sexual activity: Never    Birth control/protection: Abstinence  Lifestyle  . Physical activity:    Days per week: Not on file    Minutes per session: Not on file  . Stress: Not on file  Relationships  . Social connections:    Talks on phone: Not on file    Gets together: Not on file    Attends religious service: Not on file    Active member of club or organization: Not on file    Attends meetings of clubs or organizations: Not on file    Relationship status: Not on file  Other Topics Concern  . Not on file  Social History Narrative  . Not on file   History reviewed. No pertinent family history. No Active Allergies Prior to Admission medications   Medication Sig Start Date End Date Taking? Authorizing Provider  cephALEXin (KEFLEX) 500 MG capsule Take 1 capsule (500 mg total) by mouth 4 (four) times daily for 10 days. 10/25/17 11/04/17  Merrily Brittle, MD  gabapentin (NEURONTIN) 300 MG capsule Take 3 capsules (900 mg total) by mouth 3 (three) times daily. Patient not taking: Reported on 10/25/2017 10/09/16   Shari Prows, MD  lithium carbonate 300 MG capsule Take 1 capsule (300 mg total) by mouth 3 (three) times daily with meals. Patient not taking: Reported on 10/25/2017 10/09/16   Pucilowska, Ellin Goodie, MD  sulfamethoxazole-trimethoprim (BACTRIM DS,SEPTRA DS) 800-160 MG tablet Take 1 tablet by mouth 2 (two) times daily. 10/25/17   Merrily Brittle, MD  traZODone (DESYREL) 150 MG tablet Take 1 tablet (150 mg total) by mouth at bedtime. Patient not taking: Reported on 10/25/2017 10/09/16   Shari Prows, MD   Ct Head Wo Contrast  Result Date: 10/24/2017 CLINICAL DATA:  Overdose patient.  Unresponsive. EXAM: CT HEAD WITHOUT CONTRAST CT MAXILLOFACIAL WITHOUT CONTRAST CT CERVICAL SPINE WITHOUT CONTRAST TECHNIQUE: Multidetector CT imaging of the head, cervical spine, and maxillofacial  structures were performed using the standard protocol without intravenous contrast. Multiplanar CT image reconstructions of the cervical spine and maxillofacial structures were also generated. COMPARISON:  CT 04/28/2017 FINDINGS: CT HEAD FINDINGS Brain: The brain shows a normal appearance without evidence of malformation, atrophy, old or acute small or large vessel infarction, mass lesion, hemorrhage, hydrocephalus or extra-axial collection. Vascular: No hyperdense vessel. No evidence of atherosclerotic calcification. Skull: Normal.  No traumatic finding.  No focal bone lesion. Sinuses/Orbits: Sinuses are clear. Orbits appear normal. Mastoids are clear. Other: None significant CT MAXILLOFACIAL FINDINGS Osseous: No facial fracture or focal lesion. Orbits: Normal Sinuses: Mucosal inflammation of the maxillary sinuses right more than left. Other sinuses clear. Soft tissues: Soft tissues of the face otherwise negative. CT CERVICAL SPINE FINDINGS Alignment: No traumatic malalignment. Straightening of the normal cervical lordosis. Skull base and vertebrae: Negative.  No traumatic finding. Soft tissues and spinal canal: Negative Disc levels: Normal except for ordinary spondylosis at C5-6 with small endplate osteophytes. No significant bony narrowing of the canal or foramina. Upper chest: Negative Other: None IMPRESSION: Head CT: Normal. Facial CT: Normal except for mild mucosal inflammatory changes of the maxillary sinuses right more than left. Cervical spine CT: Negative except for mild, probably subclinical spondylosis at C5-6. Electronically Signed   By: Paulina Fusi M.D.   On: 10/24/2017 21:17   Ct Cervical Spine Wo Contrast  Result Date: 10/24/2017 CLINICAL DATA:  Overdose patient.  Unresponsive. EXAM: CT HEAD WITHOUT CONTRAST CT MAXILLOFACIAL WITHOUT CONTRAST CT CERVICAL SPINE WITHOUT CONTRAST TECHNIQUE: Multidetector CT imaging of the head, cervical spine, and maxillofacial structures were performed using the  standard protocol without intravenous contrast. Multiplanar CT image reconstructions of the cervical spine and maxillofacial structures were also generated. COMPARISON:  CT 04/28/2017 FINDINGS: CT HEAD FINDINGS Brain: The brain shows a normal appearance without evidence of malformation, atrophy, old or acute small or large vessel infarction, mass lesion, hemorrhage, hydrocephalus or extra-axial collection. Vascular: No hyperdense vessel. No evidence of atherosclerotic calcification. Skull: Normal.  No traumatic finding.  No focal bone lesion. Sinuses/Orbits: Sinuses are clear. Orbits appear normal. Mastoids are clear. Other: None significant CT MAXILLOFACIAL FINDINGS Osseous: No facial fracture or focal lesion. Orbits: Normal Sinuses: Mucosal inflammation of the maxillary sinuses right more than left. Other sinuses clear. Soft tissues: Soft tissues of the face otherwise negative. CT CERVICAL SPINE FINDINGS Alignment: No traumatic malalignment. Straightening of the normal cervical lordosis. Skull base and vertebrae: Negative.  No traumatic finding. Soft tissues and spinal canal: Negative Disc levels: Normal except for ordinary spondylosis at C5-6 with small endplate osteophytes. No significant bony narrowing of the canal or foramina. Upper chest: Negative Other: None IMPRESSION: Head CT: Normal. Facial CT: Normal except for mild mucosal inflammatory changes of the maxillary  sinuses right more than left. Cervical spine CT: Negative except for mild, probably subclinical spondylosis at C5-6. Electronically Signed   By: Paulina FusiMark  Shogry M.D.   On: 10/24/2017 21:17   Ct Maxillofacial Wo Contrast  Result Date: 10/24/2017 CLINICAL DATA:  Overdose patient.  Unresponsive. EXAM: CT HEAD WITHOUT CONTRAST CT MAXILLOFACIAL WITHOUT CONTRAST CT CERVICAL SPINE WITHOUT CONTRAST TECHNIQUE: Multidetector CT imaging of the head, cervical spine, and maxillofacial structures were performed using the standard protocol without intravenous  contrast. Multiplanar CT image reconstructions of the cervical spine and maxillofacial structures were also generated. COMPARISON:  CT 04/28/2017 FINDINGS: CT HEAD FINDINGS Brain: The brain shows a normal appearance without evidence of malformation, atrophy, old or acute small or large vessel infarction, mass lesion, hemorrhage, hydrocephalus or extra-axial collection. Vascular: No hyperdense vessel. No evidence of atherosclerotic calcification. Skull: Normal.  No traumatic finding.  No focal bone lesion. Sinuses/Orbits: Sinuses are clear. Orbits appear normal. Mastoids are clear. Other: None significant CT MAXILLOFACIAL FINDINGS Osseous: No facial fracture or focal lesion. Orbits: Normal Sinuses: Mucosal inflammation of the maxillary sinuses right more than left. Other sinuses clear. Soft tissues: Soft tissues of the face otherwise negative. CT CERVICAL SPINE FINDINGS Alignment: No traumatic malalignment. Straightening of the normal cervical lordosis. Skull base and vertebrae: Negative.  No traumatic finding. Soft tissues and spinal canal: Negative Disc levels: Normal except for ordinary spondylosis at C5-6 with small endplate osteophytes. No significant bony narrowing of the canal or foramina. Upper chest: Negative Other: None IMPRESSION: Head CT: Normal. Facial CT: Normal except for mild mucosal inflammatory changes of the maxillary sinuses right more than left. Cervical spine CT: Negative except for mild, probably subclinical spondylosis at C5-6. Electronically Signed   By: Paulina FusiMark  Shogry M.D.   On: 10/24/2017 21:17    Positive ROS: All other systems have been reviewed and were otherwise negative with the exception of those mentioned in the HPI and as above.  Physical Exam: General: Alert, no acute distress Cardiovascular: No pedal edema Respiratory: No cyanosis, no use of accessory musculature GI: No organomegaly, abdomen is soft and non-tender Skin: No lesions in the area of chief  complaint Neurologic: Sensation intact distally Psychiatric: Patient is competent for consent with normal mood and affect Lymphatic: No axillary or cervical lymphadenopathy  MUSCULOSKELETAL: The left hand is swollen and slightly red.  There is no abscess formation.  There is pain with movement of the fingers and wrist.  Neurovascular status is intact.  Cellulitis extends onto the forearm for short distance.  No other redness or signs of infection are noted.  Assessment: Cellulitis left hand and wrist.  Plan: IV antibiotics. Elevation and moist heat. Should turnaround about 36 hours with IV antibiotics.    Valinda HoarMILLER,Trelon Plush E, MD [phone]   10/25/2017 5:56 PM

## 2017-10-25 NOTE — ED Notes (Signed)
ED Provider at bedside. 

## 2017-10-25 NOTE — Consult Note (Signed)
[facility] Consult   Reason for Consult: Consult for this 32 year old woman who came into the hospital with altered mental status then found to have sepsis Referring Physician: Posey Pronto Patient Identification: Tonya Huff MRN:  [id number] Principal Diagnosis: Adjustment disorder with mixed anxiety and depressed mood Diagnosis:   Patient Active Problem List   Diagnosis Date Noted  . 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 Cellulitis [L03.90] 10/25/2017  . 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 Acute delirium [R41.0] 10/25/2017  . Adjustment disorder with mixed anxiety and depressed mood [F43.23] 10/25/2017  . Sepsis (Tonya Huff) [A41.9] 10/30/2016  . Tobacco use disorder [F17.200] 10/08/2016  . Opioid use disorder, mild, in early remission, on maintenance therapy, abuse (Tonya Huff) [F11.11] 10/08/2016  . Alcohol use disorder, moderate, dependence (Tonya Huff) [F10.20] 10/07/2016  . Cocaine use disorder, moderate, dependence (Tonya Huff) [F14.20] 10/07/2016  . 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 Suicidal ideation [R45.851] 10/07/2016  . Bipolar I disorder, most recent episode depressed, severe without psychotic features (Tonya Huff) [F31.4] 10/07/2016    Total Time spent with patient: 1 hour  Subjective:   Tonya Huff is a 32 y.o. female patient admitted with "my hand hurts".  HPI: Patient seen chart reviewed.  Patient came into the emergency room with altered mental status.  Concern immediately was for opiate overdose given her past history.  Patient however was then found to have signs and symptoms of sepsis related probably to the cellulitis in her left hand.  Patient was put under IVC out of concern that she had intentionally overdosed although I do not think there was any clear documentation that she had actually endorse suicidal ideation.  Drug screen ultimately came back negative for opiates but positive for amphetamines and benzodiazepines and cannabis.   On interview tonight the patient says she had not been depressed recently and had not tried to hurt her self and that there was no suicidal intent involved.  She denied in fact that she was using opiates although she did eventually admit to taking a couple of oral pain medicines because of the pain in her hand.  She says mostly she is been shooting up amphetamine recently to stay awake so that she can take care of her very sick mother.  She takes Xanax is then to try to put herself to sleep briefly after shooting up amphetamine.  Patient denies that she had been depressed recently.  Because of the drug abuse obviously had not been sleeping well and had not been eating normally.  Patient is not reporting any psychotic symptoms and absolutely denies any suicidal ideation now.  Not getting any outpatient mental health treatment.  Social history: Patient lives with her mother at [facility] taking care of her mother who is apparently bedbound.  Medical history: Infection of the hand.  Substance abuse history: Documented history of abuse of multiple substances including IV drug abuse with opiate abuse in the past now apparently with benzodiazepines amphetamine and somewhat recently cocaine all being more prominent  Past Psychiatric History: Patient has had psychiatric admissions in the past and has been given a diagnosis of bipolar disorder with depression although looking back over the notes it is not clear to me whether it is bipolar disorder or was just always in the context of substance abuse.  Currently she is not getting any outpatient mental health care.  She does have a past history of suicidality.  She has been noncompliant largely with attempts at outpatient mental health treatment.  Risk to Self:   Risk to Others:   Prior Inpatient Therapy:   Prior Outpatient Therapy:    Past Medical History:  Past Medical History:  Diagnosis Date  . Anxiety   . Bipolar 1 disorder (Tonya Huff)   .  Diverticulitis   . Kidney infection   . PID (pelvic inflammatory disease)    History reviewed. No pertinent surgical history. Family History: History reviewed. No pertinent family history. Family Psychiatric  History: None reported Social History:  Social History   Substance and Sexual Activity  Alcohol Use Yes  . Alcohol/week: 10.0 standard drinks  . Types: 10 Shots of liquor per week     Social History   Substance and Sexual Activity  Drug Use Yes  . Types: Marijuana, Cocaine, IV   Comment: Marijuana twice a month, cocaine one time    Social History   Socioeconomic History  . Marital status: Single    Spouse name: Not on file  . Number of children: Not on file  . Years of education: Not on file  . Highest education level: Not on file  Occupational History  . Not on file  Social Needs  . Financial resource strain: Not on file  . Food insecurity:    Worry: Not on file    Inability: Not on file  . Transportation needs:    Medical: Not on file    Non-medical: Not on file  Tobacco Use  . Smoking status: Current Every Day Smoker    Packs/day: 2.00    Years: 10.00    Pack years: 20.00    Types: Cigarettes  . Smokeless tobacco: Never Used  . Tobacco comment: Pt would like the nicotine gum  Substance and Sexual Activity  . Alcohol use: Yes    Alcohol/week: 10.0 standard drinks    Types: 10 Shots of liquor per week  . Drug use: Yes    Types: Marijuana, Cocaine, IV    Comment: Marijuana twice a month, cocaine one time  . Sexual activity: Never    Birth control/protection: Abstinence  Lifestyle  . Physical activity:    Days per week: Not on file    Minutes per session: Not on file  . Stress: Not on file  Relationships  . Social connections:    Talks on phone: Not on file    Gets together: Not on file    Attends religious service: Not on file    Active member of club or organization: Not on file    Attends meetings of clubs or organizations: Not on file     Relationship status: Not on file  Other Topics Concern  . Not on file  Social History Narrative  . Not on file   Additional Social History:    Allergies:  No Active Allergies  Labs:  Results for orders placed or performed during the hospital encounter of 10/24/17 (from the past 48 hour(s))  CBC with Differential     Status: Abnormal   Collection Time: 10/24/17  7:07 PM  Result Value Ref Range   WBC 13.2 (H) 3.6 - 11.0 K/uL   RBC 4.04 3.80 - 5.20 MIL/uL   Hemoglobin 13.1 12.0 - 16.0 g/dL   HCT 36.5 35.0 - 47.0 %   MCV 90.3 80.0 - 100.0 fL   MCH 32.4 26.0 - 34.0 pg   MCHC 35.9 32.0 - 36.0 g/dL   RDW 12.6 11.5 - 14.5 %   Platelets 243 150 - 440 K/uL   Neutrophils Relative %  88 %   Neutro Abs 11.7 (H) 1.4 - 6.5 K/uL   Lymphocytes Relative 4 %   Lymphs Abs 0.5 (L) 1.0 - 3.6 K/uL   Monocytes Relative 8 %   Monocytes Absolute 1.0 (H) 0.2 - 0.9 K/uL   Eosinophils Relative 0 %   Eosinophils Absolute 0.0 0 - 0.7 K/uL   Basophils Relative 0 %   Basophils Absolute 0.0 0 - 0.1 K/uL    Comment: Performed at [facility], [address] [place], [place] [address]  Basic metabolic panel     Status: Abnormal   Collection Time: 10/24/17  7:07 PM  Result Value Ref Range   Sodium 133 (L) 135 - 145 mmol/L   Potassium 3.8 3.5 - 5.1 mmol/L    Comment: HEMOLYSIS AT THIS LEVEL MAY AFFECT RESULT   Chloride 103 98 - 111 mmol/L   CO2 23 22 - 32 mmol/L   Glucose, Bld 107 (H) 70 - 99 mg/dL   BUN 13 6 - 20 mg/dL   Creatinine, Ser 0.57 0.44 - 1.00 mg/dL   Calcium 8.4 (L) 8.9 - 10.3 mg/dL   GFR calc non Af Amer >60 >60 mL/min   GFR calc Af Amer >60 >60 mL/min    Comment: (NOTE) The eGFR has been calculated using the CKD EPI equation. This calculation has not been validated in all clinical situations. eGFR's persistently <60 mL/min signify possible Chronic Kidney Disease.    Anion gap 7 5 - 15    Comment: Performed at [facility], [address] [place], [place]  [address]  Acetaminophen level     Status: Abnormal   Collection Time: 10/24/17  7:07 PM  Result Value Ref Range   Acetaminophen (Tylenol), Serum <10 (L) 10 - 30 ug/mL    Comment: (NOTE) Therapeutic concentrations vary significantly. A range of 10-30 ug/mL  may be an effective concentration for many patients. However, some  are best treated at concentrations outside of this range. Acetaminophen concentrations >150 ug/mL at 4 hours after ingestion  and >50 ug/mL at 12 hours after ingestion are often associated with  toxic reactions. Performed at [facility], [address] [place], [place] [address]   hCG, quantitative, pregnancy     Status: None   Collection Time: 10/24/17  7:07 PM  Result Value Ref Range   hCG, Beta Chain, Quant, S <1 <5 mIU/mL    Comment:          GEST. AGE      CONC.  (mIU/mL)   <=1 WEEK        5 - 50     2 WEEKS       50 - 500     3 WEEKS       100 - 10,000     4 WEEKS     1,000 - 30,000     5 WEEKS     3,500 - 115,000   6-8 WEEKS     12,000 - 270,000    12 WEEKS     15,000 - 220,000        FEMALE AND NON-PREGNANT FEMALE:     LESS THAN 5 mIU/mL Performed at [facility], [address] [place], [place] [address]   Ethanol     Status: None   Collection Time: 10/24/17  7:37 PM  Result Value Ref Range   Alcohol, Ethyl (B) <10 <10 mg/dL    Comment: (NOTE) Lowest detectable limit for serum alcohol is  10 mg/dL. For medical purposes only. Performed at [facility], [address] [place], [place] [address]   Salicylate level     Status: None   Collection Time: 10/24/17  7:37 PM  Result Value Ref Range   Salicylate Lvl <5.3 2.8 - 30.0 mg/dL    Comment: Performed at [facility], [address] [place], [place] [address]  Urine Drug Screen, Qualitative     Status: Abnormal   Collection Time: 10/25/17 12:32 AM  Result Value Ref Range   Tricyclic, Ur Screen NONE DETECTED NONE DETECTED   Amphetamines, Ur Screen  POSITIVE (A) NONE DETECTED   MDMA (Ecstasy)Ur Screen NONE DETECTED NONE DETECTED   Cocaine Metabolite,Ur [place] NONE DETECTED NONE DETECTED   Opiate, Ur Screen NONE DETECTED NONE DETECTED   Phencyclidine (PCP) Ur S NONE DETECTED NONE DETECTED   Cannabinoid 50 Ng, Ur [place] POSITIVE (A) NONE DETECTED   Barbiturates, Ur Screen NONE DETECTED NONE DETECTED   Benzodiazepine, Ur Scrn POSITIVE (A) NONE DETECTED   Methadone Scn, Ur NONE DETECTED NONE DETECTED    Comment: (NOTE) Tricyclics + metabolites, urine    Cutoff 1000 ng/mL Amphetamines + metabolites, urine  Cutoff 1000 ng/mL MDMA (Ecstasy), urine              Cutoff 500 ng/mL Cocaine Metabolite, urine          Cutoff 300 ng/mL Opiate + metabolites, urine        Cutoff 300 ng/mL Phencyclidine (PCP), urine         Cutoff 25 ng/mL Cannabinoid, urine                 Cutoff 50 ng/mL Barbiturates + metabolites, urine  Cutoff 200 ng/mL Benzodiazepine, urine              Cutoff 200 ng/mL Methadone, urine                   Cutoff 300 ng/mL The urine drug screen provides only a preliminary, unconfirmed analytical test result and should not be used for non-medical purposes. Clinical consideration and professional judgment should be applied to any positive drug screen result due to possible interfering substances. A more specific alternate chemical method must be used in order to obtain a confirmed analytical result. Gas chromatography / mass spectrometry (GC/MS) is the preferred confirmat ory method. Performed at [facility], [address] [place], [place] [address]   Urinalysis, Routine w reflex microscopic     Status: Abnormal   Collection Time: 10/25/17 12:32 AM  Result Value Ref Range   Color, Urine YELLOW (A) YELLOW   APPearance CLOUDY (A) CLEAR   Specific Gravity, Urine 1.023 1.005 - 1.030   pH 6.0 5.0 - 8.0   Glucose, UA NEGATIVE NEGATIVE mg/dL   Hgb urine dipstick NEGATIVE NEGATIVE   Bilirubin Urine NEGATIVE NEGATIVE    Ketones, ur 80 (A) NEGATIVE mg/dL   Protein, ur 30 (A) NEGATIVE mg/dL   Nitrite NEGATIVE NEGATIVE   Leukocytes, UA NEGATIVE NEGATIVE   RBC / HPF 6-10 0 - 5 RBC/hpf   WBC, UA 21-50 0 - 5 WBC/hpf   Bacteria, UA NONE SEEN NONE SEEN   Squamous Epithelial / LPF 21-50 0 - 5   Mucus PRESENT     Comment: Performed at [facility], [address] [place],  [address]  Lactic acid, plasma     Status: Abnormal   Collection Time: 10/25/17  9:45 AM  Result Value Ref  Range   Lactic Acid, Venous 3.5 (HH) 0.5 - 1.9 mmol/L    Comment: CRITICAL RESULT CALLED TO, READ BACK BY AND VERIFIED WITH [facility] NEEDHAM AT 1036 10/25/17 DAS Performed at [facility], [address] [place], [place] [address]   CBC     Status: Abnormal   Collection Time: 10/25/17  9:50 AM  Result Value Ref Range   WBC 9.9 3.6 - 11.0 K/uL   RBC 3.77 (L) 3.80 - 5.20 MIL/uL   Hemoglobin 12.2 12.0 - 16.0 g/dL   HCT 35.4 35.0 - 47.0 %   MCV 94.0 80.0 - 100.0 fL   MCH 32.3 26.0 - 34.0 pg   MCHC 34.4 32.0 - 36.0 g/dL   RDW 12.9 11.5 - 14.5 %   Platelets 204 150 - 440 K/uL    Comment: Performed at [facility], [address] [place], [place] [address]  Creatinine, serum     Status: None   Collection Time: 10/25/17  9:50 AM  Result Value Ref Range   Creatinine, Ser 0.61 0.44 - 1.00 mg/dL   GFR calc non Af Amer >60 >60 mL/min   GFR calc Af Amer >60 >60 mL/min    Comment: (NOTE) The eGFR has been calculated using the CKD EPI equation. This calculation has not been validated in all clinical situations. eGFR's persistently <60 mL/min signify possible Chronic Kidney Disease. Performed at [facility], [address] [place], [place] [address]   Lactic acid, plasma     Status: None   Collection Time: 10/25/17  2:08 PM  Result Value Ref Range   Lactic Acid, Venous 1.5 0.5 - 1.9 mmol/L    Comment: Performed at [facility], [address] [place], [place] [address]     Current Facility-Administered Medications  Medication Dose Route Frequency Provider Last Rate Last Dose  . 0.9 %  sodium chloride infusion   Intravenous Continuous Bettey Costa, MD   Stopped at 10/25/17 1853  . acetaminophen (TYLENOL) tablet 650 mg  650 mg Oral Q6H PRN Bettey Costa, MD       Or  . acetaminophen (TYLENOL) suppository 650 mg  650 mg Rectal Q6H PRN Mody, Sital, MD      . bisacodyl (DULCOLAX) EC tablet 5 mg  5 mg Oral Daily PRN Mody, Sital, MD      . enoxaparin (LOVENOX) injection 40 mg  40 mg Subcutaneous Q24H Mody, Sital, MD   40 mg at 10/25/17 2022  . haloperidol lactate (HALDOL) injection 5 mg  5 mg Intravenous Once Eula Listen, MD      . HYDROcodone-acetaminophen (NORCO/VICODIN) 5-325 MG per tablet 1-2 tablet  1-2 tablet Oral Q4H PRN Bettey Costa, MD   2 tablet at 10/25/17 2021  . HYDROmorphone (DILAUDID) injection 0.5 mg  0.5 mg Intravenous Q3H PRN Bettey Costa, MD   0.5 mg at 10/25/17 1803  . morphine 2 MG/ML injection 2 mg  2 mg Intravenous Q4H PRN Mody, Sital, MD      . nicotine (NICODERM CQ - dosed in mg/24 hours) patch 21 mg  21 mg Transdermal Daily Mody, Sital, MD   21 mg at 10/25/17 1414  . nicotine polacrilex (NICORETTE) gum 2 mg  2 mg Oral PRN Bettey Costa, MD      . ondansetron (ZOFRAN) tablet 4 mg  4 mg Oral Q6H PRN Mody, Sital, MD       Or  . ondansetron (ZOFRAN) injection 4 mg  4 mg Intravenous Q6H PRN Mody,  Sital, MD      . polyethylene glycol (MIRALAX / GLYCOLAX) packet 17 g  17 g Oral Daily PRN Mody, Sital, MD      . temazepam (RESTORIL) capsule 15 mg  15 mg Oral QHS Clapacs, John T, MD      . traZODone (DESYREL) tablet 100 mg  100 mg Oral QHS Clapacs, John T, MD      . Derrill Memo ON 10/26/2017] vancomycin (VANCOCIN) IVPB 750 mg/150 ml premix  750 mg Intravenous Q12H Ramond Dial, RPH        Musculoskeletal: Strength & Muscle Tone: decreased Gait & Station: unable to stand Patient leans: N/A  Psychiatric Specialty Exam: Physical Exam   Nursing note and vitals reviewed. Constitutional: She appears well-developed and well-nourished.  HENT:  Head: Normocephalic and atraumatic.  Eyes: Pupils are equal, round, and reactive to light. Conjunctivae are normal.  Neck: Normal range of motion.  Cardiovascular: Regular rhythm and normal heart sounds.  Respiratory: Effort normal. No respiratory distress.  GI: Soft.  Musculoskeletal: Normal range of motion.  Neurological: She is alert.  Skin: Skin is warm and dry.     Psychiatric: Her mood appears anxious. Her speech is slurred. She is agitated. She is not aggressive. Thought content is not paranoid and not delusional. Cognition and memory are impaired. She expresses impulsivity. She expresses no homicidal and no suicidal ideation.    Review of Systems  Constitutional: Negative.   HENT: Negative.   Eyes: Negative.   Respiratory: Negative.   Cardiovascular: Negative.   Gastrointestinal: Negative.   Musculoskeletal: Negative.   Skin: Negative.        Pretty obvious pain in the left hand  Neurological: Negative.   Psychiatric/Behavioral: Positive for memory loss and substance abuse. Negative for depression, hallucinations and suicidal ideas. The patient is nervous/anxious and has insomnia.     Blood pressure 119/79, pulse (!) 115, temperature 99 F (37.2 C), temperature source Oral, resp. rate 15, height _0  (1.575 m), weight 59 kg, SpO2 100 %.Body mass index is 23.79 kg/m.  General Appearance: Disheveled  Eye Contact:  Fair  Speech:  Slow and Slurred  Volume:  Decreased  Mood:  Dysphoric  Affect:  Congruent  Thought Process:  Goal Directed  Orientation:  Full (Time, Place, and Person)  Thought Content:  Logical  Suicidal Thoughts:  No  Homicidal Thoughts:  No  Memory:  Immediate;   Fair Recent;   Poor Remote;   Fair  Judgement:  Fair  Insight:  Fair  Psychomotor Activity:  Decreased  Concentration:  Concentration: Fair  Recall:  [employer] of Knowledge:  Fair   Language:  Fair  Akathisia:  No  Handed:  Right  AIMS (if indicated):     Assets:  Desire for Improvement Housing Resilience  ADL's:  Impaired  Cognition:  Impaired,  Mild  Sleep:        Treatment Plan Summary: Daily contact with patient to assess and evaluate symptoms and progress in treatment, Medication management and Plan Patient came into the hospital with altered mental status which was at first assumed to be an overdose of opiates.  It is possible that could have played some part in it although it is equally possible that it was the combination of being sick and may be the Xanax she was taking.  In any case I do not think we have any clear reason to think that she is suicidal or requires involuntary commitment.  I have discontinued the commitment  and discontinued the sitter.  I spoke with the patient and tried to emphasize to her very clearly that the infection was something she could potentially die from and so it was crucial that she stay in the hospital and get the antibiotic treatment as recommended by the doctors.  She expressed understanding of this.  We will try and keep her pain appropriately under control and I have also ordered some medicine to sleep specifically 15 mg of Restoril and also 100 mg of trazodone which between the 2 of them I would hope could get her a decent night sleep.  I will follow-up as needed.  Disposition: No evidence of imminent risk to self or others at present.   Patient does not meet criteria for psychiatric inpatient admission. Supportive therapy provided about ongoing stressors.  Alethia Berthold, MD 10/25/2017 8:42 PM

## 2017-10-25 NOTE — ED Notes (Signed)
Area of redness on left arm marked by W. R. BerkleyCassie RN

## 2017-10-25 NOTE — ED Notes (Signed)
Pt tearful and iritatble. Requesting pain medicine for her left hand. RN informed EDP.

## 2017-10-25 NOTE — Consult Note (Signed)
Pharmacy Antibiotic Note  Tonya Huff is a 32 y.o. female admitted on 10/24/2017 with cellulitis.  Pharmacy has been consulted for vancomycin dosing.  Plan: Vancomycin 1000mg  once then Vancomycin 750mg   IV every 12 hours.  Goal trough 10-15 mcg/mL. Trough prior to the 5th dose  Height: 5\' 2"  (157.5 cm) Weight: 130 lb 1.1 oz (59 kg) IBW/kg (Calculated) : 50.1  Temp (24hrs), Avg:98.8 F (37.1 C), Min:98 F (36.7 C), Max:99.3 F (37.4 C)  Recent Labs  Lab 10/24/17 1907 10/25/17 0945 10/25/17 0950 10/25/17 1408  WBC 13.2*  --  9.9  --   CREATININE 0.57  --  0.61  --   LATICACIDVEN  --  3.5*  --  1.5    Estimated Creatinine Clearance: 78.4 mL/min (by C-G formula based on SCr of 0.61 mg/dL).    No Active Allergies  Antimicrobials this admission: kefelx 9/16 >> 9/16 bactrim 9/16 >> 9/16 clinda 9/16 one dose Vancomycin 9/16>>  Dose adjustments this admission:   Microbiology results: 9/16 BCx:    Thank you for allowing pharmacy to be a part of this patient's care.  Olene FlossMelissa D Laurna Shetley, Pharm.D, BCPS Clinical Pharmacist 10/25/2017 8:09 PM

## 2017-10-25 NOTE — Progress Notes (Signed)
Patient seen and examined D/w dr Sharma Covertnorman needs ortho and MRI before admit to make sure they are ok with him being here if he have abscess

## 2017-10-25 NOTE — ED Notes (Signed)
Pt to mri 

## 2017-10-25 NOTE — Progress Notes (Signed)
This nurse went to the ED and brought patient up to the room during code red in the ED.  Patient's ED nurse Tonya Huff stated patient's belongings would be brought up to the unit however they have yet to be brought up at this time.  Tonya Apeanielle Hilda Rynders, RN, BSN

## 2017-10-25 NOTE — ED Provider Notes (Signed)
-----------------------------------------   12:35 AM on 10/25/2017 -----------------------------------------  Patient awake, crying due to hand pain.  Now denying heroin use.  Gave urine specimen.  Will administer second liter IV fluids and 15mg  IV Toradol for pain.   ----------------------------------------- 1:39 AM on 10/25/2017 -----------------------------------------  1 mg IV Ativan given for patient tearful.  Left hand examination: Mild swelling, warmth and erythema to dorsal aspect with mild swelling to her fingers.  Will elevate affected area and apply light ice.  Patient already received antibiotics.  She will be dressed out and transferred to the psychiatric part of the emergency department pending psychiatric evaluation and disposition.   Irean HongSung, [place] Hennick J, MD 10/25/17 [id number]

## 2017-10-26 ENCOUNTER — Inpatient Hospital Stay: Payer: Self-pay

## 2017-10-26 LAB — BASIC METABOLIC PANEL
ANION GAP: 5 (ref 5–15)
BUN: 7 mg/dL (ref 6–20)
CALCIUM: 7.8 mg/dL — AB (ref 8.9–10.3)
CO2: 23 mmol/L (ref 22–32)
Chloride: 109 mmol/L (ref 98–111)
Creatinine, Ser: 0.57 mg/dL (ref 0.44–1.00)
GFR calc Af Amer: >60 mL/min (ref >60)
GLUCOSE: 99 mg/dL (ref 70–99)
Potassium: 3.2 mmol/L — ABNORMAL LOW (ref 3.5–5.1)
SODIUM: 137 mmol/L (ref 135–145)

## 2017-10-26 LAB — CBC
HCT: 33.1 % — ABNORMAL LOW (ref 35.0–47.0)
Hemoglobin: 11.5 g/dL — ABNORMAL LOW (ref 12.0–16.0)
MCH: 32.4 pg (ref 26.0–34.0)
MCHC: 34.7 g/dL (ref 32.0–36.0)
MCV: 93.6 fL (ref 80.0–100.0)
PLATELETS: 194 10*3/uL (ref 150–440)
RBC: 3.54 MIL/uL — ABNORMAL LOW (ref 3.80–5.20)
RDW: 13.2 % (ref 11.5–14.5)
WBC: 7 10*3/uL (ref 3.6–11.0)

## 2017-10-26 MED ORDER — BLISTEX MEDICATED EX OINT
TOPICAL_OINTMENT | CUTANEOUS | Status: DC | PRN
  Administered 2017-10-26: 08:00:00 via TOPICAL
  Filled 2017-10-26: qty 6.3

## 2017-10-26 MED ORDER — DOCUSATE SODIUM 100 MG PO CAPS
100.0000 mg | ORAL_CAPSULE | Freq: Two times a day (BID) | ORAL | Status: DC
  Administered 2017-10-26 – 2017-10-28 (×4): 100 mg via ORAL
  Filled 2017-10-26 (×5): qty 1

## 2017-10-26 MED ORDER — QUETIAPINE FUMARATE 25 MG PO TABS
150.0000 mg | ORAL_TABLET | Freq: Every day | ORAL | Status: DC
  Administered 2017-10-26 – 2017-10-27 (×2): 150 mg via ORAL
  Filled 2017-10-26 (×2): qty 6

## 2017-10-26 MED ORDER — OXYCODONE-ACETAMINOPHEN 5-325 MG PO TABS
1.0000 | ORAL_TABLET | ORAL | Status: DC | PRN
  Administered 2017-10-26: 11:00:00 1 via ORAL
  Filled 2017-10-26: qty 1

## 2017-10-26 MED ORDER — SENNA 8.6 MG PO TABS
1.0000 | ORAL_TABLET | Freq: Every day | ORAL | Status: DC
  Administered 2017-10-26: 8.6 mg via ORAL
  Filled 2017-10-26: qty 1

## 2017-10-26 MED ORDER — HYDROMORPHONE HCL 1 MG/ML IJ SOLN
1.0000 mg | INTRAMUSCULAR | Status: DC | PRN
  Administered 2017-10-26 – 2017-10-28 (×11): 1 mg via INTRAVENOUS
  Filled 2017-10-26 (×12): qty 1

## 2017-10-26 MED ORDER — GADOBENATE DIMEGLUMINE 529 MG/ML IV SOLN
15.0000 mL | Freq: Once | INTRAVENOUS | Status: AC | PRN
  Administered 2017-10-26: 12 mL via INTRAVENOUS

## 2017-10-26 MED ORDER — ALPRAZOLAM 1 MG PO TABS
2.0000 mg | ORAL_TABLET | ORAL | Status: AC
  Administered 2017-10-26: 2 mg via ORAL
  Filled 2017-10-26: qty 2

## 2017-10-26 MED ORDER — OXYCODONE-ACETAMINOPHEN 5-325 MG PO TABS
1.0000 | ORAL_TABLET | ORAL | Status: DC | PRN
  Administered 2017-10-26 – 2017-10-27 (×4): 2 via ORAL
  Filled 2017-10-26 (×4): qty 2

## 2017-10-26 MED ORDER — ALPRAZOLAM 1 MG PO TABS
1.0000 mg | ORAL_TABLET | Freq: Three times a day (TID) | ORAL | Status: DC
  Administered 2017-10-27 – 2017-10-28 (×3): 1 mg via ORAL
  Filled 2017-10-26 (×3): qty 1

## 2017-10-26 NOTE — Consult Note (Signed)
Pharmacy Antibiotic Note  Tonya Huff is a 32 y.o. female admitted on 10/24/2017 with sepsis secondary to left hand cellulitis/abscess cellulitis.  Pharmacy has been consulted for vancomycin dosing.  Plan: Continue Vancomycin 750mg   IV every 12 hours.  Goal trough 15-20 mcg/mL (Abscess). Trough ordered prior to the 5th dose  Height: 5\' 2"  (157.5 cm) Weight: 130 lb 1.1 oz (59 kg) IBW/kg (Calculated) : 50.1  Temp (24hrs), Avg:98.6 F (37 C), Min:98 F (36.7 C), Max:99.3 F (37.4 C)  Recent Labs  Lab 10/24/17 1907 10/25/17 0945 10/25/17 0950 10/25/17 1408 10/26/17 0407  WBC 13.2*  --  9.9  --  7.0  CREATININE 0.57  --  0.61  --  0.57  LATICACIDVEN  --  3.5*  --  1.5  --     Estimated Creatinine Clearance: 78.4 mL/min (by C-G formula based on SCr of 0.57 mg/dL).    Allergies  Allergen Reactions  . Haldol [Haloperidol Lactate] Other (See Comments)    Pnt reports dystonia with med    Antimicrobials this admission: kefelx 9/16 >> 9/16 bactrim 9/16 >> 9/16 clinda 9/16 one dose Vancomycin 9/16>>  Microbiology results: 9/16 BCx:   Thank you for allowing pharmacy to be a part of this patient's care.  Tonya Huff, PharmD, BCPS Clinical Pharmacist 10/26/2017 9:56 AM

## 2017-10-26 NOTE — Progress Notes (Signed)
Subjective: Complains of pain still.  No numbness or tingling in the hand.  Alert and sitting up in bed today.  MRI showed a small abscess in the dorsum of the hand.       Patient reports pain as moderate.  Objective:   VITALS:   Vitals:   10/25/17 1923 10/26/17 0406  BP: 119/79 103/67  Pulse: (!) 115 (!) 106  Resp: 15 15  Temp: 99 F (37.2 C) 98.1 F (36.7 C)  SpO2: 100% 100%    Neurologically intact ABD soft Neurovascular intact Sensation intact distally Intact pulses distally  Cellulitis is not as intense as yesterday and swelling is down. There is no sign of pointing to the abscess.   LABS Recent Labs    10/24/17 1907 10/25/17 0950 10/26/17 0407  HGB 13.1 12.2 11.5*  HCT 36.5 35.4 33.1*  WBC 13.2* 9.9 7.0  PLT 243 204 194    Recent Labs    10/24/17 1907 10/25/17 0950 10/26/17 0407  NA 133*  --  137  K 3.8  --  3.2*  BUN 13  --  7  CREATININE 0.57 0.61 0.57  GLUCOSE 107*  --  99    No results for input(s): LABPT, INR in the last 72 hours.   Assessment/Plan: Normal white count no fever.   Cellulitis/small abscess left hand.  Recommend continue IV antibiotics and elevation. Moist heat on the hand and arm. Elevate the arm. Will reassess tomorrow.  I do not believe that I&D is indicated at this time.  Starts to point then we can elect to open this.

## 2017-10-26 NOTE — Progress Notes (Signed)
[facility] - [place] at [facility]   PATIENT NAME: Tonya Huff    MR#:  [id number]  DATE OF BIRTH:  34/25/1987  SUBJECTIVE:   Patient with pain this am and anxiety  REVIEW OF SYSTEMS:    Review of Systems  Constitutional: Negative for fever, chills weight loss HENT: Negative for ear pain, nosebleeds, congestion, facial swelling, rhinorrhea, neck pain, neck stiffness and ear discharge.   Respiratory: Negative for cough, shortness of breath, wheezing  Cardiovascular: Negative for chest pain, palpitations and leg swelling.  Gastrointestinal: Negative for heartburn, abdominal pain, vomiting, diarrhea or consitpation Genitourinary: Negative for dysuria, urgency, frequency, hematuria Musculoskeletal: Negative for back pain or joint pain  + left hand pain Neurological: Negative for dizziness, seizures, syncope, focal weakness,  numbness and headaches.  Hematological: Does not bruise/bleed easily.  Psychiatric/Behavioral: Negative for hallucinations, confusion, dysphoric mood + ANXIETY    Tolerating Diet: yes      DRUG ALLERGIES:   Allergies  Allergen Reactions  . Haldol [Haloperidol Lactate] Other (See Comments)    Pnt reports dystonia with med    VITALS:  Blood pressure 103/67, pulse (!) 106, temperature 98.1 F (36.7 C), resp. rate 15, height 5\' 2"  (1.575 m), weight 59 kg, SpO2 100 %.  PHYSICAL EXAMINATION:  Constitutional: Appears well-developed and well-nourished. No distress. HENT: Normocephalic. Marland Kitchen Oropharynx is clear and moist.  Eyes: Conjunctivae and EOM are normal. PERRLA, no scleral icterus.  Neck: Normal ROM. Neck supple. No JVD. No tracheal deviation. CVS: RRR, S1/S2 +, no murmurs, no gallops, no carotid bruit.  Pulmonary: Effort and breath sounds normal, no stridor, rhonchi, wheezes, rales.  Abdominal: Soft. BS +,  no distension, tenderness, rebound or guarding.  Musculoskeletal: Normal range of motion. No edema and no tenderness.   Neuro: Alert. CN 2-12 grossly intact. No focal deficits. Skin:left hand with fluctuance and swelling Cellulitis improving Psychiatric: Normal mood and affect.      LABORATORY PANEL:   CBC Recent Labs  Lab 10/26/17 0407  WBC 7.0  HGB 11.5*  HCT 33.1*  PLT 194   ------------------------------------------------------------------------------------------------------------------  Chemistries  Recent Labs  Lab 10/26/17 0407  NA 137  K 3.2*  CL 109  CO2 23  GLUCOSE 99  BUN 7  CREATININE 0.57  CALCIUM 7.8*   ------------------------------------------------------------------------------------------------------------------  Cardiac Enzymes No results for input(s): TROPONINI in the last 168 hours. ------------------------------------------------------------------------------------------------------------------  RADIOLOGY:  Ct Head Wo Contrast  Result Date: 10/24/2017 CLINICAL DATA:  Overdose patient.  Unresponsive. EXAM: CT HEAD WITHOUT CONTRAST CT MAXILLOFACIAL WITHOUT CONTRAST CT CERVICAL SPINE WITHOUT CONTRAST TECHNIQUE: Multidetector CT imaging of the head, cervical spine, and maxillofacial structures were performed using the standard protocol without intravenous contrast. Multiplanar CT image reconstructions of the cervical spine and maxillofacial structures were also generated. COMPARISON:  CT 04/28/2017 FINDINGS: CT HEAD FINDINGS Brain: The brain shows a normal appearance without evidence of malformation, atrophy, old or acute small or large vessel infarction, mass lesion, hemorrhage, hydrocephalus or extra-axial collection. Vascular: No hyperdense vessel. No evidence of atherosclerotic calcification. Skull: Normal.  No traumatic finding.  No focal bone lesion. Sinuses/Orbits: Sinuses are clear. Orbits appear normal. Mastoids are clear. Other: None significant CT MAXILLOFACIAL FINDINGS Osseous: No facial fracture or focal lesion. Orbits: Normal Sinuses: Mucosal inflammation of  the maxillary sinuses right more than left. Other sinuses clear. Soft tissues: Soft tissues of the face otherwise negative. CT CERVICAL SPINE FINDINGS Alignment: No traumatic malalignment. Straightening of the normal cervical lordosis. Skull base and vertebrae: Negative.  No  traumatic finding. Soft tissues and spinal canal: Negative Disc levels: Normal except for ordinary spondylosis at C5-6 with small endplate osteophytes. No significant bony narrowing of the canal or foramina. Upper chest: Negative Other: None IMPRESSION: Head CT: Normal. Facial CT: Normal except for mild mucosal inflammatory changes of the maxillary sinuses right more than left. Cervical spine CT: Negative except for mild, probably subclinical spondylosis at C5-6. Electronically Signed   By: Paulina Fusi M.D.   On: 10/24/2017 21:17   Ct Cervical Spine Wo Contrast  Result Date: 10/24/2017 CLINICAL DATA:  Overdose patient.  Unresponsive. EXAM: CT HEAD WITHOUT CONTRAST CT MAXILLOFACIAL WITHOUT CONTRAST CT CERVICAL SPINE WITHOUT CONTRAST TECHNIQUE: Multidetector CT imaging of the head, cervical spine, and maxillofacial structures were performed using the standard protocol without intravenous contrast. Multiplanar CT image reconstructions of the cervical spine and maxillofacial structures were also generated. COMPARISON:  CT 04/28/2017 FINDINGS: CT HEAD FINDINGS Brain: The brain shows a normal appearance without evidence of malformation, atrophy, old or acute small or large vessel infarction, mass lesion, hemorrhage, hydrocephalus or extra-axial collection. Vascular: No hyperdense vessel. No evidence of atherosclerotic calcification. Skull: Normal.  No traumatic finding.  No focal bone lesion. Sinuses/Orbits: Sinuses are clear. Orbits appear normal. Mastoids are clear. Other: None significant CT MAXILLOFACIAL FINDINGS Osseous: No facial fracture or focal lesion. Orbits: Normal Sinuses: Mucosal inflammation of the maxillary sinuses right more than  left. Other sinuses clear. Soft tissues: Soft tissues of the face otherwise negative. CT CERVICAL SPINE FINDINGS Alignment: No traumatic malalignment. Straightening of the normal cervical lordosis. Skull base and vertebrae: Negative.  No traumatic finding. Soft tissues and spinal canal: Negative Disc levels: Normal except for ordinary spondylosis at C5-6 with small endplate osteophytes. No significant bony narrowing of the canal or foramina. Upper chest: Negative Other: None IMPRESSION: Head CT: Normal. Facial CT: Normal except for mild mucosal inflammatory changes of the maxillary sinuses right more than left. Cervical spine CT: Negative except for mild, probably subclinical spondylosis at C5-6. Electronically Signed   By: Paulina Fusi M.D.   On: 10/24/2017 21:17   Mr Hand Left W Wo Contrast  Result Date: 10/26/2017 CLINICAL DATA:  Left hand pain and swelling for the past 24-36 hours. History of IV heroin injection with overdose yesterday and Narcan administration to recover her. Mild leukocytosis. EXAM: MRI OF THE LEFT HAND WITHOUT AND WITH CONTRAST TECHNIQUE: Multiplanar, multisequence MR imaging of the left hand was performed before and after the administration of intravenous contrast. CONTRAST:  12mL MULTIHANCE GADOBENATE DIMEGLUMINE 529 MG/ML IV SOLN COMPARISON:  None. FINDINGS: Bones/Joint/Cartilage No marrow signal abnormality, fracture or joint effusion. Ligaments Noncontributory Muscles and Tendons Intact flexor and extensor tendons crossing the wrist and hand. No significant tenosynovitis. Soft tissues Deep to the extensor tendons of the second through fifth digits is and irregular enhancing fluid collection consistent with a soft tissue abscess measuring at least 3.7 x 0.8 x 3.1 cm (transverse by AP by [email or web address]), series 24 image 29. Moderate degree of subcutaneous soft tissue edema consistent with cellulitis is otherwise noted of the dorsum of the included wrist, hand and second through fifth  digits. IMPRESSION: 1. Cellulitis of the dorsum of the included wrist, hand and second through fifth digits. 2. Deep to the extensor tendons of the second through fifth digits at the level of the metacarpals is an enhancing fluid collection measuring approximately 3.7 x 0.8 x 3.1 cm consistent with a soft tissue abscess. 3. No marrow signal abnormality suggestive of osteomyelitis. Electronically Signed  By: Tollie Ethavid  Kwon M.D.   On: 10/26/2017 02:06   Ct Maxillofacial Wo Contrast  Result Date: 10/24/2017 CLINICAL DATA:  Overdose patient.  Unresponsive. EXAM: CT HEAD WITHOUT CONTRAST CT MAXILLOFACIAL WITHOUT CONTRAST CT CERVICAL SPINE WITHOUT CONTRAST TECHNIQUE: Multidetector CT imaging of the head, cervical spine, and maxillofacial structures were performed using the standard protocol without intravenous contrast. Multiplanar CT image reconstructions of the cervical spine and maxillofacial structures were also generated. COMPARISON:  CT 04/28/2017 FINDINGS: CT HEAD FINDINGS Brain: The brain shows a normal appearance without evidence of malformation, atrophy, old or acute small or large vessel infarction, mass lesion, hemorrhage, hydrocephalus or extra-axial collection. Vascular: No hyperdense vessel. No evidence of atherosclerotic calcification. Skull: Normal.  No traumatic finding.  No focal bone lesion. Sinuses/Orbits: Sinuses are clear. Orbits appear normal. Mastoids are clear. Other: None significant CT MAXILLOFACIAL FINDINGS Osseous: No facial fracture or focal lesion. Orbits: Normal Sinuses: Mucosal inflammation of the maxillary sinuses right more than left. Other sinuses clear. Soft tissues: Soft tissues of the face otherwise negative. CT CERVICAL SPINE FINDINGS Alignment: No traumatic malalignment. Straightening of the normal cervical lordosis. Skull base and vertebrae: Negative.  No traumatic finding. Soft tissues and spinal canal: Negative Disc levels: Normal except for ordinary spondylosis at C5-6 with  small endplate osteophytes. No significant bony narrowing of the canal or foramina. Upper chest: Negative Other: None IMPRESSION: Head CT: Normal. Facial CT: Normal except for mild mucosal inflammatory changes of the maxillary sinuses right more than left. Cervical spine CT: Negative except for mild, probably subclinical spondylosis at C5-6. Electronically Signed   By: Paulina FusiMark  Shogry M.D.   On: 10/24/2017 21:17     ASSESSMENT AND PLAN:   32 year old female with tobacco dependence, heroin abuse and bipolar who initially presented to the emergency room after overdose on heroin and now has a left hand cellulitis.  1.  Sepsis: Patient ended with elevated white blood cell count, tachycardia and tachypnea Sepsis is due to left hand cellulitis/ abscess MRI confirms abscess.  I spoke with Dr. Hyacinth MeekerMiller who is recommending to continue IV antibiotics and he will see patient later today.   Sepsis is improving   2.  Left hand cellulitis/abscess: Continue vancomycin Blood cell count is improving. Continue to elevate hand and continue PRN pain medications  3.  Substance abuse heroin overdose: She has been eval by psychiatry.  Involuntary commitment papers have been discontinued.   Toxicology positive for amphetamines, benzodiazepines and marijuana   4.  Hyponatremia from sepsis: Has improved with IV fluids 5.Tobacco dependence: Patient is encouraged to quit smoking. Counseling was provided.. Nicotine patch and nicotine gum ordered as per patient's request  6.  Anxiety: Management as per psychiatry    Management plans discussed with the patient and she is in agreement.  CODE STATUS: Full  TOTAL TIME TAKING CARE OF THIS PATIENT: 30 minutes.     POSSIBLE D/C 1-2 days, DEPENDING ON CLINICAL CONDITION.   Khayman Kirsch M.D on 10/26/2017 at 9:43 AM  Between 7am to 6pm - Pager - [phone] After 6pm go to [email or web address] - password [email or web address]  [facility]  Office   [phone]  CC: Primary care physician; Patient, No Pcp Per  Note: This dictation was prepared with Dragon dictation along with smaller phrase technology. Any transcriptional errors that result from this process are unintentional.

## 2017-10-26 NOTE — Consult Note (Signed)
[facility] Consult   Reason for Consult: Follow-up consult for this 32 year old woman with cellulitis of her hand. Referring Physician: Posey Pronto Patient Identification: Tonya Huff MRN:  [id number] Principal Diagnosis: Adjustment disorder with mixed anxiety and depressed mood Diagnosis:   Patient Active Problem List   Diagnosis Date Noted  . Amphetamine abuse ([place]) [F15.10] 10/25/2017  . Cellulitis [L03.90] 10/25/2017  . Benzodiazepine abuse ([place]) [F13.10] 10/25/2017  . Acute delirium [R41.0] 10/25/2017  . Adjustment disorder with mixed anxiety and depressed mood [F43.23] 10/25/2017  . Sepsis ([employer]) [A41.9] 10/30/2016  . Tobacco use disorder [F17.200] 10/08/2016  . Opioid use disorder, mild, in early remission, on maintenance therapy, abuse ([place]) [F11.11] 10/08/2016  . Alcohol use disorder, moderate, dependence ([place]) [F10.20] 10/07/2016  . Cocaine use disorder, moderate, dependence ([place]) [F14.20] 10/07/2016  . 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 Suicidal ideation [R45.851] 10/07/2016  . Bipolar I disorder, most recent episode depressed, severe without psychotic features ([place]) [F31.4] 10/07/2016    Total Time spent with patient: 30 minutes  Subjective:   Tonya Huff is a 32 y.o. female patient admitted with "it is even worse".  HPI: Patient seen chart reviewed.  Patient was pacing back and forth in her room crying.  Still could not sleep last night even with to sleeping medicines.  Pain is even more distressing today.  Anxiety feels overwhelming.  Patient had been taking Xanax regularly probably at least 2 mg a day before coming into the hospital.  She is not reporting any suicidal ideation not reporting any psychosis.  Past Psychiatric History: History of long-standing substance abuse  Risk to Self:   Risk to Others:   Prior Inpatient Therapy:   Prior Outpatient Therapy:    Past Medical History:  Past Medical History:  Diagnosis Date    . Anxiety   . Bipolar 1 disorder ([place])   . Diverticulitis   . Kidney infection   . PID (pelvic inflammatory disease)    History reviewed. No pertinent surgical history. Family History: History reviewed. No pertinent family history. Family Psychiatric  History: See previous note Social History:  Social History   Substance and Sexual Activity  Alcohol Use Yes  . Alcohol/week: 10.0 standard drinks  . Types: 10 Shots of liquor per week     Social History   Substance and Sexual Activity  Drug Use Yes  . Types: Marijuana, Cocaine, IV   Comment: Marijuana twice a month, cocaine one time    Social History   Socioeconomic History  . Marital status: Single    Spouse name: Not on file  . Number of children: Not on file  . Years of education: Not on file  . Highest education level: Not on file  Occupational History  . Not on file  Social Needs  . Financial resource strain: Not on file  . Food insecurity:    Worry: Not on file    Inability: Not on file  . Transportation needs:    Medical: Not on file    Non-medical: Not on file  Tobacco Use  . Smoking status: Current Every Day Smoker    Packs/day: 2.00    Years: 10.00    Pack years: 20.00    Types: Cigarettes  . Smokeless tobacco: Never Used  . Tobacco comment: Pt would like the nicotine gum  Substance and Sexual Activity  . Alcohol use: Yes    Alcohol/week: 10.0 standard drinks    Types: 10 Shots of liquor per week  .  Drug use: Yes    Types: Marijuana, Cocaine, IV    Comment: Marijuana twice a month, cocaine one time  . Sexual activity: Never    Birth control/protection: Abstinence  Lifestyle  . Physical activity:    Days per week: Not on file    Minutes per session: Not on file  . Stress: Not on file  Relationships  . Social connections:    Talks on phone: Not on file    Gets together: Not on file    Attends religious service: Not on file    Active member of club or organization: Not on file    Attends  meetings of clubs or organizations: Not on file    Relationship status: Not on file  Other Topics Concern  . Not on file  Social History Narrative  . Not on file   Additional Social History:    Allergies:   Allergies  Allergen Reactions  . Haldol [Haloperidol Lactate] Other (See Comments)    Pnt reports dystonia with med    Labs:  Results for orders placed or performed during the hospital encounter of 10/24/17 (from the past 48 hour(s))  Urine Drug Screen, Qualitative     Status: Abnormal   Collection Time: 10/25/17 12:32 AM  Result Value Ref Range   Tricyclic, Ur Screen NONE DETECTED NONE DETECTED   Amphetamines, Ur Screen POSITIVE (A) NONE DETECTED   MDMA (Ecstasy)Ur Screen NONE DETECTED NONE DETECTED   Cocaine Metabolite,Ur [place] NONE DETECTED NONE DETECTED   Opiate, Ur Screen NONE DETECTED NONE DETECTED   Phencyclidine (PCP) Ur S NONE DETECTED NONE DETECTED   Cannabinoid 50 Ng, Ur [place] POSITIVE (A) NONE DETECTED   Barbiturates, Ur Screen NONE DETECTED NONE DETECTED   Benzodiazepine, Ur Scrn POSITIVE (A) NONE DETECTED   Methadone Scn, Ur NONE DETECTED NONE DETECTED    Comment: (NOTE) Tricyclics + metabolites, urine    Cutoff 1000 ng/mL Amphetamines + metabolites, urine  Cutoff 1000 ng/mL MDMA (Ecstasy), urine              Cutoff 500 ng/mL Cocaine Metabolite, urine          Cutoff 300 ng/mL Opiate + metabolites, urine        Cutoff 300 ng/mL Phencyclidine (PCP), urine         Cutoff 25 ng/mL Cannabinoid, urine                 Cutoff 50 ng/mL Barbiturates + metabolites, urine  Cutoff 200 ng/mL Benzodiazepine, urine              Cutoff 200 ng/mL Methadone, urine                   Cutoff 300 ng/mL The urine drug screen provides only a preliminary, unconfirmed analytical test result and should not be used for non-medical purposes. Clinical consideration and professional judgment should be applied to any positive drug screen result due to possible interfering substances. A more  specific alternate chemical method must be used in order to obtain a confirmed analytical result. Gas chromatography / mass spectrometry (GC/MS) is the preferred confirmat ory method. Performed at [facility], [address] [place], [place] [address]   Urinalysis, Routine w reflex microscopic     Status: Abnormal   Collection Time: 10/25/17 12:32 AM  Result Value Ref Range   Color, Urine YELLOW (A) YELLOW   APPearance CLOUDY (A) CLEAR   Specific Gravity, Urine 1.023 1.005 - 1.030   pH  6.0 5.0 - 8.0   Glucose, UA NEGATIVE NEGATIVE mg/dL   Hgb urine dipstick NEGATIVE NEGATIVE   Bilirubin Urine NEGATIVE NEGATIVE   Ketones, ur 80 (A) NEGATIVE mg/dL   Protein, ur 30 (A) NEGATIVE mg/dL   Nitrite NEGATIVE NEGATIVE   Leukocytes, UA NEGATIVE NEGATIVE   RBC / HPF 6-10 0 - 5 RBC/hpf   WBC, UA 21-50 0 - 5 WBC/hpf   Bacteria, UA NONE SEEN NONE SEEN   Squamous Epithelial / LPF 21-50 0 - 5   Mucus PRESENT     Comment: Performed at [facility], [address] [place], [place] [address]  Lactic acid, plasma     Status: Abnormal   Collection Time: 10/25/17  9:45 AM  Result Value Ref Range   Lactic Acid, Venous 3.5 (HH) 0.5 - 1.9 mmol/L    Comment: CRITICAL RESULT CALLED TO, READ BACK BY AND VERIFIED WITH [facility] NEEDHAM AT 1036 10/25/17 DAS Performed at [facility], [address] [place], [place] [address]   Blood culture (routine x 2)     Status: None (Preliminary result)   Collection Time: 10/25/17  9:50 AM  Result Value Ref Range   Specimen Description BLOOD RIGHT ANTECUBITAL    Special Requests      BOTTLES DRAWN AEROBIC AND ANAEROBIC Blood Culture adequate volume   Culture      NO GROWTH < 24 HOURS Performed at [facility], [address] [place], [place] [address]    Report Status PENDING   Blood culture (routine x 2)     Status: None (Preliminary result)   Collection Time: 10/25/17  9:50 AM  Result Value Ref Range   Specimen  Description BLOOD BLOOD RIGHT HAND    Special Requests      BOTTLES DRAWN AEROBIC AND ANAEROBIC Blood Culture adequate volume   Culture      NO GROWTH < 24 HOURS Performed at [facility], [address] [place], [place] [address]    Report Status PENDING   CBC     Status: Abnormal   Collection Time: 10/25/17  9:50 AM  Result Value Ref Range   WBC 9.9 3.6 - 11.0 K/uL   RBC 3.77 (L) 3.80 - 5.20 MIL/uL   Hemoglobin 12.2 12.0 - 16.0 g/dL   HCT 35.4 35.0 - 47.0 %   MCV 94.0 80.0 - 100.0 fL   MCH 32.3 26.0 - 34.0 pg   MCHC 34.4 32.0 - 36.0 g/dL   RDW 12.9 11.5 - 14.5 %   Platelets 204 150 - 440 K/uL    Comment: Performed at [facility], [address] [place], [place] [address]  Creatinine, serum     Status: None   Collection Time: 10/25/17  9:50 AM  Result Value Ref Range   Creatinine, Ser 0.61 0.44 - 1.00 mg/dL   GFR calc non Af Amer >60 >60 mL/min   GFR calc Af Amer >60 >60 mL/min    Comment: (NOTE) The eGFR has been calculated using the CKD EPI equation. This calculation has not been validated in all clinical situations. eGFR's persistently <60 mL/min signify possible Chronic Kidney Disease. Performed at [facility], [address] [place], [place] [address]   Lactic acid, plasma     Status: None   Collection Time: 10/25/17  2:08 PM  Result Value Ref Range   Lactic Acid, Venous 1.5 0.5 - 1.9 mmol/L    Comment: Performed at [facility], [address] [place], [place] [address]  Basic metabolic panel     Status: Abnormal   Collection Time: 10/26/17  4:07 AM  Result Value Ref Range   Sodium 137 135 - 145 mmol/L   Potassium 3.2 (L) 3.5 - 5.1 mmol/L   Chloride 109 98 - 111 mmol/L   CO2 23 22 - 32 mmol/L   Glucose, Bld 99 70 - 99 mg/dL   BUN 7 6 - 20 mg/dL   Creatinine, Ser 0.57 0.44 - 1.00 mg/dL   Calcium 7.8 (L) 8.9 - 10.3 mg/dL   GFR calc non Af Amer >60 >60 mL/min   GFR calc Af Amer >60 >60 mL/min    Comment:  (NOTE) The eGFR has been calculated using the CKD EPI equation. This calculation has not been validated in all clinical situations. eGFR's persistently <60 mL/min signify possible Chronic Kidney Disease.    Anion gap 5 5 - 15    Comment: Performed at [facility], [address] [place], [place] [address]  CBC     Status: Abnormal   Collection Time: 10/26/17  4:07 AM  Result Value Ref Range   WBC 7.0 3.6 - 11.0 K/uL   RBC 3.54 (L) 3.80 - 5.20 MIL/uL   Hemoglobin 11.5 (L) 12.0 - 16.0 g/dL   HCT 33.1 (L) 35.0 - 47.0 %   MCV 93.6 80.0 - 100.0 fL   MCH 32.4 26.0 - 34.0 pg   MCHC 34.7 32.0 - 36.0 g/dL   RDW 13.2 11.5 - 14.5 %   Platelets 194 150 - 440 K/uL    Comment: Performed at [facility], [address] [place], [place] [address]    Current Facility-Administered Medications  Medication Dose Route Frequency Provider Last Rate Last Dose  . acetaminophen (TYLENOL) tablet 650 mg  650 mg Oral Q6H PRN Bettey Costa, MD   650 mg at 10/26/17 2004   Or  . acetaminophen (TYLENOL) suppository 650 mg  650 mg Rectal Q6H PRN Bettey Costa, MD      . Derrill Memo ON 10/27/2017] ALPRAZolam (XANAX) tablet 1 mg  1 mg Oral TID Zakariye Nee T, MD      . ALPRAZolam Duanne Moron) tablet 2 mg  2 mg Oral NOW Akshaj Besancon T, MD      . bisacodyl (DULCOLAX) EC tablet 5 mg  5 mg Oral Daily PRN Bettey Costa, MD   5 mg at 10/26/17 0849  . docusate sodium (COLACE) capsule 100 mg  100 mg Oral BID Bettey Costa, MD   100 mg at 10/26/17 1105  . enoxaparin (LOVENOX) injection 40 mg  40 mg Subcutaneous Q24H Mody, Sital, MD   40 mg at 10/25/17 2022  . haloperidol lactate (HALDOL) injection 5 mg  5 mg Intravenous Once Eula Listen, MD      . HYDROmorphone (DILAUDID) injection 1 mg  1 mg Intravenous Q3H PRN Bettey Costa, MD   1 mg at 10/26/17 1845  . lip balm (BLISTEX) ointment   Topical PRN Amelia Jo, MD      . nicotine (NICODERM CQ - dosed in mg/24 hours) patch 21 mg  21 mg Transdermal Daily Bettey Costa, MD   21 mg at 10/26/17 1105  . nicotine polacrilex (NICORETTE) gum 2 mg  2 mg Oral PRN Bettey Costa, MD   2 mg at 10/26/17 1526  . ondansetron (ZOFRAN) tablet 4 mg  4 mg Oral Q6H PRN Bettey Costa, MD       Or  . ondansetron (ZOFRAN) injection  4 mg  4 mg Intravenous Q6H PRN Bettey Costa, MD      . oxyCODONE-acetaminophen (PERCOCET/ROXICET) 5-325 MG per tablet 1-2 tablet  1-2 tablet Oral Q4H PRN Bettey Costa, MD   2 tablet at 10/26/17 1645  . polyethylene glycol (MIRALAX / GLYCOLAX) packet 17 g  17 g Oral Daily PRN Bettey Costa, MD   17 g at 10/26/17 0135  . QUEtiapine (SEROQUEL) tablet 150 mg  150 mg Oral QHS Mora Pedraza T, MD      . senna (SENOKOT) tablet 8.6 mg  1 tablet Oral QHS Mody, Sital, MD      . vancomycin (VANCOCIN) IVPB 750 mg/150 ml premix  750 mg Intravenous Q12H Ramond Dial, RPH   Stopped at 10/26/17 1007    Musculoskeletal: Strength & Muscle Tone: within normal limits Gait & Station: normal Patient leans: N/A  Psychiatric Specialty Exam: Physical Exam  Nursing note and vitals reviewed. Constitutional: She appears well-developed and well-nourished.  HENT:  Head: Normocephalic and atraumatic.  Eyes: Pupils are equal, round, and reactive to light. Conjunctivae are normal.  Neck: Normal range of motion.  Cardiovascular: Regular rhythm and normal heart sounds.  Respiratory: Effort normal and breath sounds normal.  GI: Soft.  Musculoskeletal: Normal range of motion.  Neurological: She is alert.  Skin: Skin is warm and dry.     Psychiatric: Judgment normal. Her mood appears anxious. Her speech is rapid and/or pressured. She is agitated. She is not aggressive and not combative. Thought content is not paranoid. Cognition and memory are normal. She expresses no homicidal and no suicidal ideation.    Review of Systems  Constitutional: Negative.   HENT: Negative.   Eyes: Negative.   Respiratory: Negative.   Cardiovascular: Negative.   Gastrointestinal: Negative.    Musculoskeletal: Positive for joint pain.  Skin: Negative.   Neurological: Negative.   Psychiatric/Behavioral: Negative for depression, hallucinations, memory loss, substance abuse and suicidal ideas. The patient is nervous/anxious and has insomnia.     Blood pressure (!) 132/106, pulse (!) 120, temperature 100.2 F (37.9 C), temperature source Oral, resp. rate 18, height '5\' 2"'$  (1.575 m), weight 59 kg, SpO2 100 %.Body mass index is 23.79 kg/m.  General Appearance: Casual  Eye Contact:  Fair  Speech:  Normal Rate  Volume:  Decreased  Mood:  Depressed and Dysphoric  Affect:  Depressed and Tearful  Thought Process:  Coherent  Orientation:  Full (Time, Place, and Person)  Thought Content:  Logical  Suicidal Thoughts:  No  Homicidal Thoughts:  No  Memory:  Immediate;   Fair Recent;   Fair Remote;   Fair  Judgement:  Fair  Insight:  Fair  Psychomotor Activity:  Restlessness  Concentration:  Concentration: Fair  Recall:  [employer] of Knowledge:  Fair  Language:  Fair  Akathisia:  No  Handed:  Right  AIMS (if indicated):     Assets:  Desire for Improvement Resilience  ADL's:  Impaired  Cognition:  WNL  Sleep:        Treatment Plan Summary: Daily contact with patient to assess and evaluate symptoms and progress in treatment, Medication management and Plan Patient appears to be genuinely in distress.  If she was taking benzodiazepines at least 2 mg of Xanax a day she will probably need more than that to compensate for the withdrawal and the added stress.  I have put in an order to give her a 2 mg Xanax now and then 1 mg 3 times a  day starting tomorrow morning.  I have also added Seroquel 150 mg at night to hopefully try to get her to sleep by a different mechanism.  Continue to follow regularly.  Continues not to need psychiatric hospitalization or commitment  Disposition: No evidence of imminent risk to self or others at present.   Patient does not meet criteria for psychiatric  inpatient admission. Supportive therapy provided about ongoing stressors.  Alethia Berthold, MD 10/26/2017 9:40 PM

## 2017-10-27 ENCOUNTER — Inpatient Hospital Stay: Payer: Self-pay | Admitting: Anesthesiology

## 2017-10-27 ENCOUNTER — Encounter
Admission: EM | Discharge status: Against Medical Advice | Payer: Self-pay | Source: Home / Self Care | Attending: Internal Medicine

## 2017-10-27 ENCOUNTER — Encounter: Payer: Self-pay | Admitting: Anesthesiology

## 2017-10-27 HISTORY — PX: I&D EXTREMITY: SHX5045

## 2017-10-27 LAB — BASIC METABOLIC PANEL
ANION GAP: 8 (ref 5–15)
BUN: 9 mg/dL (ref 6–20)
CO2: 30 mmol/L (ref 22–32)
Calcium: 9.2 mg/dL (ref 8.9–10.3)
Chloride: 102 mmol/L (ref 98–111)
Creatinine, Ser: 0.63 mg/dL (ref 0.44–1.00)
Glucose, Bld: 103 mg/dL — ABNORMAL HIGH (ref 70–99)
POTASSIUM: 3.8 mmol/L (ref 3.5–5.1)
SODIUM: 140 mmol/L (ref 135–145)

## 2017-10-27 LAB — CBC
HEMATOCRIT: 34.9 % — AB (ref 35.0–47.0)
HEMOGLOBIN: 12.2 g/dL (ref 12.0–16.0)
MCH: 32.4 pg (ref 26.0–34.0)
MCHC: 35 g/dL (ref 32.0–36.0)
MCV: 92.5 fL (ref 80.0–100.0)
Platelets: 264 10*3/uL (ref 150–440)
RBC: 3.77 MIL/uL — ABNORMAL LOW (ref 3.80–5.20)
RDW: 13.1 % (ref 11.5–14.5)
WBC: 9.5 10*3/uL (ref 3.6–11.0)

## 2017-10-27 LAB — PROTIME-INR
INR: 0.95
Prothrombin Time: 12.6 seconds (ref 11.4–15.2)

## 2017-10-27 SURGERY — IRRIGATION AND DEBRIDEMENT EXTREMITY
Anesthesia: General | Site: Hand | Laterality: Left

## 2017-10-27 MED ORDER — CLINDAMYCIN PHOSPHATE 600 MG/50ML IV SOLN
INTRAVENOUS | Status: AC
  Filled 2017-10-27: qty 50

## 2017-10-27 MED ORDER — CLINDAMYCIN PHOSPHATE 600 MG/50ML IV SOLN
600.0000 mg | Freq: Three times a day (TID) | INTRAVENOUS | Status: DC
  Administered 2017-10-27 – 2017-10-28 (×2): 600 mg via INTRAVENOUS
  Filled 2017-10-27 (×3): qty 50

## 2017-10-27 MED ORDER — OXYCODONE-ACETAMINOPHEN 5-325 MG PO TABS: 1.0000 | ORAL_TABLET | ORAL | Status: DC | PRN

## 2017-10-27 MED ORDER — MORPHINE SULFATE (PF) 2 MG/ML IV SOLN: 1.0000 mg | INTRAVENOUS | Status: DC | PRN

## 2017-10-27 MED ORDER — FENTANYL CITRATE (PF) 100 MCG/2ML IJ SOLN
INTRAMUSCULAR | Status: AC
  Administered 2017-10-27: 50 ug via INTRAVENOUS
  Filled 2017-10-27: qty 2

## 2017-10-27 MED ORDER — ONDANSETRON HCL 4 MG/2ML IJ SOLN: 4.0000 mg | Freq: Four times a day (QID) | INTRAMUSCULAR | Status: DC | PRN

## 2017-10-27 MED ORDER — MIDAZOLAM HCL 2 MG/2ML IJ SOLN
INTRAMUSCULAR | Status: AC
  Filled 2017-10-27: qty 2

## 2017-10-27 MED ORDER — HYDROCODONE-ACETAMINOPHEN 5-325 MG PO TABS
1.0000 | ORAL_TABLET | ORAL | Status: DC | PRN
  Administered 2017-10-27 – 2017-10-28 (×3): 2 via ORAL
  Filled 2017-10-27 (×3): qty 2

## 2017-10-27 MED ORDER — PROPOFOL 10 MG/ML IV BOLUS
INTRAVENOUS | Status: DC | PRN
  Administered 2017-10-27: 150 mg via INTRAVENOUS

## 2017-10-27 MED ORDER — FAMOTIDINE 20 MG PO TABS: 20.0000 mg | ORAL_TABLET | Freq: Two times a day (BID) | ORAL | Status: DC | PRN

## 2017-10-27 MED ORDER — PROMETHAZINE HCL 25 MG/ML IJ SOLN
INTRAMUSCULAR | Status: AC
  Administered 2017-10-27: 12.5 mg via INTRAVENOUS
  Filled 2017-10-27: qty 1

## 2017-10-27 MED ORDER — BUPIVACAINE HCL (PF) 0.5 % IJ SOLN
INTRAMUSCULAR | Status: AC
  Filled 2017-10-27: qty 30

## 2017-10-27 MED ORDER — METHOCARBAMOL 500 MG PO TABS
500.0000 mg | ORAL_TABLET | Freq: Four times a day (QID) | ORAL | Status: DC | PRN
  Filled 2017-10-27: qty 1

## 2017-10-27 MED ORDER — ONDANSETRON HCL 4 MG PO TABS: 4.0000 mg | ORAL_TABLET | Freq: Four times a day (QID) | ORAL | Status: DC | PRN

## 2017-10-27 MED ORDER — DEXAMETHASONE SODIUM PHOSPHATE 10 MG/ML IJ SOLN
INTRAMUSCULAR | Status: DC | PRN
  Administered 2017-10-27: 5 mg via INTRAVENOUS

## 2017-10-27 MED ORDER — VANCOMYCIN HCL IN DEXTROSE 750-5 MG/150ML-% IV SOLN: 750.0000 mg | INTRAVENOUS | Status: DC | PRN

## 2017-10-27 MED ORDER — CLINDAMYCIN PHOSPHATE 600 MG/50ML IV SOLN
INTRAVENOUS | Status: DC | PRN
  Administered 2017-10-27: 600 mg via INTRAVENOUS

## 2017-10-27 MED ORDER — OXYCODONE HCL 5 MG PO TABS: 5.0000 mg | ORAL_TABLET | Freq: Once | ORAL | Status: DC | PRN

## 2017-10-27 MED ORDER — SODIUM CHLORIDE FLUSH 0.9 % IV SOLN
INTRAVENOUS | Status: AC
  Filled 2017-10-27: qty 10

## 2017-10-27 MED ORDER — MIDAZOLAM HCL 2 MG/2ML IJ SOLN
INTRAMUSCULAR | Status: DC | PRN
  Administered 2017-10-27: 2 mg via INTRAVENOUS

## 2017-10-27 MED ORDER — ONDANSETRON HCL 4 MG/2ML IJ SOLN
INTRAMUSCULAR | Status: DC | PRN
  Administered 2017-10-27: 4 mg via INTRAVENOUS

## 2017-10-27 MED ORDER — FENTANYL CITRATE (PF) 100 MCG/2ML IJ SOLN
25.0000 ug | INTRAMUSCULAR | Status: DC | PRN
  Administered 2017-10-27 (×2): 50 ug via INTRAVENOUS

## 2017-10-27 MED ORDER — NEOMYCIN-POLYMYXIN B GU 40-200000 IR SOLN
Status: DC | PRN
  Administered 2017-10-27: 2 mL

## 2017-10-27 MED ORDER — KETAMINE HCL 50 MG/ML IJ SOLN
INTRAMUSCULAR | Status: AC
  Filled 2017-10-27: qty 10

## 2017-10-27 MED ORDER — CEFAZOLIN SODIUM-DEXTROSE 2-3 GM-%(50ML) IV SOLR
INTRAVENOUS | Status: DC | PRN
  Administered 2017-10-27: 2 g via INTRAVENOUS

## 2017-10-27 MED ORDER — OXYCODONE HCL 5 MG/5ML PO SOLN: 5.0000 mg | Freq: Once | ORAL | Status: DC | PRN

## 2017-10-27 MED ORDER — ALPRAZOLAM 0.5 MG PO TABS: 0.5000 mg | ORAL_TABLET | Freq: Four times a day (QID) | ORAL | Status: DC | PRN

## 2017-10-27 MED ORDER — KETAMINE HCL 10 MG/ML IJ SOLN
INTRAMUSCULAR | Status: DC | PRN
  Administered 2017-10-27: 30 mg via INTRAVENOUS

## 2017-10-27 MED ORDER — FENTANYL CITRATE (PF) 100 MCG/2ML IJ SOLN
INTRAMUSCULAR | Status: AC
  Filled 2017-10-27: qty 2

## 2017-10-27 MED ORDER — CEFAZOLIN SODIUM-DEXTROSE 2-4 GM/100ML-% IV SOLN
2.0000 g | Freq: Three times a day (TID) | INTRAVENOUS | Status: DC
  Administered 2017-10-28: 2 g via INTRAVENOUS
  Filled 2017-10-27 (×6): qty 100

## 2017-10-27 MED ORDER — PROPOFOL 10 MG/ML IV BOLUS
INTRAVENOUS | Status: AC
  Filled 2017-10-27: qty 20

## 2017-10-27 MED ORDER — FENTANYL CITRATE (PF) 100 MCG/2ML IJ SOLN
INTRAMUSCULAR | Status: DC | PRN
  Administered 2017-10-27 (×2): 25 ug via INTRAVENOUS
  Administered 2017-10-27: 50 ug via INTRAVENOUS

## 2017-10-27 MED ORDER — LIDOCAINE HCL (CARDIAC) PF 100 MG/5ML IV SOSY
PREFILLED_SYRINGE | INTRAVENOUS | Status: DC | PRN
  Administered 2017-10-27: 60 mg via INTRAVENOUS

## 2017-10-27 MED ORDER — BISACODYL 5 MG PO TBEC
10.0000 mg | DELAYED_RELEASE_TABLET | Freq: Every day | ORAL | Status: DC
  Administered 2017-10-27 – 2017-10-28 (×2): 10 mg via ORAL
  Filled 2017-10-27 (×2): qty 2

## 2017-10-27 MED ORDER — METHOCARBAMOL 1000 MG/10ML IJ SOLN
500.0000 mg | Freq: Four times a day (QID) | INTRAVENOUS | Status: DC | PRN
  Filled 2017-10-27: qty 5

## 2017-10-27 MED ORDER — MIDAZOLAM HCL 2 MG/2ML IJ SOLN
INTRAMUSCULAR | Status: AC
  Administered 2017-10-27: 16:00:00 1 mg via INTRAVENOUS
  Filled 2017-10-27: qty 2

## 2017-10-27 MED ORDER — CEFAZOLIN SODIUM 1 G IJ SOLR
INTRAMUSCULAR | Status: AC
  Filled 2017-10-27: qty 20

## 2017-10-27 MED ORDER — LACTATED RINGERS IV SOLN: INTRAVENOUS | Status: DC | PRN

## 2017-10-27 MED ORDER — MIDAZOLAM HCL 2 MG/2ML IJ SOLN
1.0000 mg | Freq: Once | INTRAMUSCULAR | Status: AC
  Administered 2017-10-27: 1 mg via INTRAVENOUS

## 2017-10-27 MED ORDER — SODIUM CHLORIDE 0.45 % IV SOLN
INTRAVENOUS | Status: DC
  Administered 2017-10-27: 20:00:00 via INTRAVENOUS

## 2017-10-27 MED ORDER — PROMETHAZINE HCL 25 MG/ML IJ SOLN
12.5000 mg | Freq: Four times a day (QID) | INTRAMUSCULAR | Status: DC | PRN
  Administered 2017-10-27: 12.5 mg via INTRAVENOUS

## 2017-10-27 MED ORDER — SODIUM CHLORIDE 0.9 % IV SOLN
INTRAVENOUS | Status: DC
  Administered 2017-10-27: 12:00:00 via INTRAVENOUS

## 2017-10-27 SURGICAL SUPPLY — 31 items
BANDAGE ELASTIC 3 LF NS (GAUZE/BANDAGES/DRESSINGS) ×3 IMPLANT
BLADE SURG MINI STRL (BLADE) ×3 IMPLANT
BNDG CMPR MED 5X3 ELC HKLP NS (GAUZE/BANDAGES/DRESSINGS) ×1
BNDG ESMARK 4X12 TAN STRL LF (GAUZE/BANDAGES/DRESSINGS) ×3 IMPLANT
CANISTER SUCT 1200ML W/VALVE (MISCELLANEOUS) ×3 IMPLANT
CAST PADDING 3X4FT ST 30246 (SOFTGOODS) ×4
CHLORAPREP W/TINT 26ML (MISCELLANEOUS) ×3 IMPLANT
CUFF TOURN 18 STER (MISCELLANEOUS) ×2 IMPLANT
CUFF TOURN 24 STER (MISCELLANEOUS) IMPLANT
DRSG GAUZE FLUFF 36X18 (GAUZE/BANDAGES/DRESSINGS) ×6 IMPLANT
ELECT REM PT RETURN 9FT ADLT (ELECTROSURGICAL) ×3
ELECTRODE REM PT RTRN 9FT ADLT (ELECTROSURGICAL) ×1 IMPLANT
GAUZE PETRO XEROFOAM 1X8 (MISCELLANEOUS) ×3 IMPLANT
GAUZE SPONGE 4X4 12PLY STRL (GAUZE/BANDAGES/DRESSINGS) ×1 IMPLANT
GLOVE BIO SURGEON STRL SZ8 (GLOVE) ×3 IMPLANT
GOWN STRL REUS W/ TWL LRG LVL3 (GOWN DISPOSABLE) ×1 IMPLANT
GOWN STRL REUS W/TWL LRG LVL3 (GOWN DISPOSABLE) ×3
GOWN STRL REUS W/TWL LRG LVL4 (GOWN DISPOSABLE) ×3 IMPLANT
KIT TURNOVER KIT A (KITS) ×3 IMPLANT
LOOP RED MAXI  1X406MM (MISCELLANEOUS) ×2
LOOP VESSEL MAXI 1X406 RED (MISCELLANEOUS) IMPLANT
NS IRRIG 500ML POUR BTL (IV SOLUTION) ×3 IMPLANT
PACK EXTREMITY ARMC (MISCELLANEOUS) ×3 IMPLANT
PAD CAST CTTN 3X4 STRL (SOFTGOODS) ×1 IMPLANT
PAD PREP 24X41 OB/GYN DISP (PERSONAL CARE ITEMS) ×3 IMPLANT
PADDING CAST COTTON 3X4 STRL (SOFTGOODS) ×2
SPLINT CAST 1 STEP 3X12 (MISCELLANEOUS) ×2 IMPLANT
SPONGE LAP 18X18 RF (DISPOSABLE) ×2 IMPLANT
STOCKINETTE 48X4 2 PLY STRL (GAUZE/BANDAGES/DRESSINGS) ×1 IMPLANT
STOCKINETTE STRL 4IN 9604848 (GAUZE/BANDAGES/DRESSINGS) ×3 IMPLANT
SUT ETHILON 4 0 P 3 18 (SUTURE) ×3 IMPLANT

## 2017-10-27 NOTE — Progress Notes (Signed)
Pt returned to room 124  Post procedure. Fingers left wm to touch. dsg intact no s/s bleeding. Elevated . Pt in and out of drosiness/ alert though.

## 2017-10-27 NOTE — Anesthesia Preprocedure Evaluation (Signed)
Anesthesia Evaluation  Patient identified by MRN, date of birth, ID band Patient awake    Reviewed: Allergy & Precautions, H&P , NPO status , Patient's Chart, lab work & pertinent test results  History of Anesthesia Complications Negative for: history of anesthetic complications  Airway Mallampati: II  TM Distance: >3 FB Neck ROM: full    Dental  (+) Chipped, Poor Dentition   Pulmonary neg shortness of breath, Current Smoker,           Cardiovascular Exercise Tolerance: Good (-) angina(-) Past MI and (-) DOE negative cardio ROS       Neuro/Psych PSYCHIATRIC DISORDERS Anxiety Bipolar Disorder negative neurological ROS     GI/Hepatic negative GI ROS, Neg liver ROS, neg GERD  ,(+)     substance abuse  ,   Endo/Other  negative endocrine ROS  Renal/GU      Musculoskeletal   Abdominal   Peds  Hematology negative hematology ROS (+)   Anesthesia Other Findings Past Medical History: No date: Anxiety No date: Bipolar 1 disorder ([name]) No date: Diverticulitis No date: Kidney infection No date: PID (pelvic inflammatory disease)  History reviewed. No pertinent surgical history.  BMI    Body Mass Index:  23.79 kg/m      Reproductive/Obstetrics negative OB ROS                             Anesthesia Physical Anesthesia Plan  ASA: III  Anesthesia Plan: General LMA   Post-op Pain Management:    Induction: Intravenous  PONV Risk Score and Plan: Ondansetron, Dexamethasone, Midazolam and Treatment may vary due to age or medical condition  Airway Management Planned: LMA  Additional Equipment:   Intra-op Plan:   Post-operative Plan: Extubation in OR  Informed Consent: I have reviewed the patients History and Physical, chart, labs and discussed the procedure including the risks, benefits and alternatives for the proposed anesthesia with the patient or authorized representative who has  indicated his/her understanding and acceptance.   Dental Advisory Given  Plan Discussed with: Anesthesiologist, CRNA and Surgeon  Anesthesia Plan Comments: (Patient consented for risks of anesthesia including but not limited to:  - adverse reactions to medications - damage to teeth, lips or other oral mucosa - sore throat or hoarseness - Damage to heart, brain, lungs or loss of life  Patient voiced understanding.)        Anesthesia Quick Evaluation

## 2017-10-27 NOTE — Anesthesia Postprocedure Evaluation (Signed)
Anesthesia Post Note  Patient: Dola ArgyleJennifer Lynn Fundora  Procedure(s) Performed: IRRIGATION AND DEBRIDEMENT EXTREMITY (Left Hand)  Patient location during evaluation: PACU Anesthesia Type: General Level of consciousness: awake and alert Pain management: pain level controlled Vital Signs Assessment: post-procedure vital signs reviewed and stable Respiratory status: spontaneous breathing, nonlabored ventilation, respiratory function stable and patient connected to nasal cannula oxygen Cardiovascular status: blood pressure returned to baseline and stable Postop Assessment: no apparent nausea or vomiting Anesthetic complications: no     Last Vitals:  Vitals:   10/27/17 1754 10/27/17 1808  BP: 119/87 (!) 125/91  Pulse: 91 94  Resp: 13 18  Temp: 36.4 C 36.6 C  SpO2: 98% 100%    Last Pain:  Vitals:   10/27/17 1808  TempSrc: Oral  PainSc:                  Cleda MccreedyJoseph K Chennel Olivos

## 2017-10-27 NOTE — Plan of Care (Signed)

## 2017-10-27 NOTE — Transfer of Care (Signed)
Immediate Anesthesia Transfer of Care Note  Patient: Tonya Huff  Procedure(s) Performed: IRRIGATION AND DEBRIDEMENT EXTREMITY (Left Hand)  Patient Location: PACU  Anesthesia Type:General  Level of Consciousness: drowsy and patient cooperative  Airway & Oxygen Therapy: Patient Spontanous Breathing and Patient connected to face mask oxygen  Post-op Assessment: Report given to RN and Post -op Vital signs reviewed and stable  Post vital signs: Reviewed and stable  Last Vitals:  Vitals Value Taken Time  BP 96/63 10/27/2017  5:20 PM  Temp    Pulse 87 10/27/2017  5:20 PM  Resp 15 10/27/2017  5:20 PM  SpO2 100 % 10/27/2017  5:20 PM  Vitals shown include unvalidated device data.  Last Pain:  Vitals:   10/27/17 1221  TempSrc:   PainSc: 8       Patients Stated Pain Goal: 3 (10/26/17 0805)  Complications: No apparent anesthesia complications

## 2017-10-27 NOTE — Consult Note (Addendum)
Pharmacy Antibiotic Note  Tonya Huff is a 32 y.o. female admitted on 10/24/2017 with cellulitis.  Pharmacy has been consulted for vancomycin dosing.  Plan: Vancomycin 1000mg  once then Vancomycin 750mg   IV every 12 hours.  Goal trough 10-15 mcg/mL. Trough prior to the 5th dose  Height: 5\' 2"  (157.5 cm) Weight: 130 lb 1.1 oz (59 kg) IBW/kg (Calculated) : 50.1  Temp (24hrs), Avg:98.1 F (36.7 C), Min:97.6 F (36.4 C), Max:98.8 F (37.1 C)  Recent Labs  Lab 10/24/17 1907 10/25/17 0945 10/25/17 0950 10/25/17 1408 10/26/17 0407 10/27/17 0458 10/27/17 1029  WBC 13.2*  --  9.9  --  7.0  --  9.5  CREATININE 0.57  --  0.61  --  0.57 0.63  --   LATICACIDVEN  --  3.5*  --  1.5  --   --   --     Estimated Creatinine Clearance: 78.4 mL/min (by C-G formula based on SCr of 0.63 mg/dL).    Allergies  Allergen Reactions  . Haldol [Haloperidol Lactate] Other (See Comments)    Pnt reports dystonia with med    Antimicrobials this admission: kefelx 9/16 >> 9/16 bactrim 9/16 >> 9/16 clinda 9/16 one dose Vancomycin 9/16>>  Dose adjustments this admission: 9/18 PM vanc level 48. Order changed to PRN. Does not appear to have been drawn during or shortly after infusion. Random level with AM labs.  Per lab, level may be erroneous. Will continue with random level in AM.  9/19 AM vanc random 6. Will restart vancomycin at 1 gram q 12 hours. Level before 3rd new dose   Microbiology results: 9/16 BCx:    Thank you for allowing pharmacy to be a part of this patient's care.  Fulton ReekMatt Storm Dulski, PharmD, BCPS  10/28/17 12:42 AM

## 2017-10-27 NOTE — H&P (Signed)
THE PATIENT WAS SEEN PRIOR TO SURGERY TODAY.  HISTORY, ALLERGIES, HOME MEDICATIONS AND OPERATIVE PROCEDURE WERE REVIEWED. RISKS AND BENEFITS OF SURGERY DISCUSSED WITH PATIENT AGAIN.  NO CHANGES FROM INITIAL HISTORY AND PHYSICAL NOTED.    

## 2017-10-27 NOTE — Op Note (Signed)
10/24/2017 - 10/27/2017  5:11 PM  PATIENT:  Tonya Huff  32 y.o. female  PRE-OPERATIVE DIAGNOSIS:  infected left hand DORSAL  POST-OPERATIVE DIAGNOSIS:  Same  PROCEDURE:  IRRIGATION AND DEBRIDEMENT LEFT HAND DORSUM  SURGEON:  Adelai Achey E Kaemon Barnett MD  ASST:    ANESTHESIA:   General  EBL:  MINIMAL  TOURNIQUET TIME:  24 MIN  OPERATIVE FINDINGS: Moderate amount of creamy white pus beneath the extensor tendons over the back of the left hand.  Tendons were intact.  OPERATIVE PROCEDURE: The patient was brought to the operating room and underwent satisfactory general LMA anesthesia in the supine position.  The left arm was prepped and draped in sterile fashion.  It was elevated and tourniquet inflated to 250 mmHg.  A longitudinal incision was made over the dorsum of the hand centrally.  Blunt dissection was carried out with a hemostat and a moderate amount of creamy yellow pus was freed up.  Cultures were taken.  IV Kefzol and clindamycin were given after that.  Using a standard hemostat and blunt dissection the area beneath the extensor tendons was dissected proximally and distally.  Soft tissues were squeezed aggressively to extrude all of the pus possible.  The wound was then irrigated thoroughly with GU irrigation.  Small bleeders were coagulated.  A vessel loop drain was placed in the wound and the skin then loosely closed with 3-0 nylon sutures.  Well-padded hand dressing with a volar splint was applied.  Tourniquet was deflated with good return of blood flow to the hand.  Patient was awakened and taken recovery in good condition.  [name], MD

## 2017-10-27 NOTE — Progress Notes (Signed)
Pt left  For  Procedure  At this time

## 2017-10-27 NOTE — Progress Notes (Signed)
Spoke with patient's 34 year old son, whom states that the patient's mother has her cell phone and purse.  Patient made aware and verbalized understanding.  Orson Apeanielle Susette Seminara, RN, BSN

## 2017-10-27 NOTE — Anesthesia Procedure Notes (Signed)
Procedure Name: LMA Insertion Date/Time: 10/27/2017 4:22 PM Performed by: Omer JackWeatherly, Mollie Rossano, CRNA Pre-anesthesia Checklist: Patient identified, Patient being monitored, Timeout performed, Emergency Drugs available and Suction available Patient Re-evaluated:Patient Re-evaluated prior to induction Oxygen Delivery Method: Circle system utilized Preoxygenation: Pre-oxygenation with 100% oxygen Induction Type: IV induction Ventilation: Mask ventilation without difficulty LMA: LMA inserted LMA Size: 3.5 Tube type: Oral Number of attempts: 1 Placement Confirmation: positive ETCO2 and breath sounds checked- equal and bilateral Tube secured with: Tape Dental Injury: Teeth and Oropharynx as per pre-operative assessment

## 2017-10-27 NOTE — Progress Notes (Signed)
[facility] -  at [facility]   PATIENT NAME: Tonya Huff    MR#:  [id number]  DATE OF BIRTH:  1985/04/04  SUBJECTIVE:   Patient still with some mild erythema and swelling of the hand.  She had a low-grade fever yesterday  REVIEW OF SYSTEMS:    Review of Systems  Constitutional: As it of low-grade fever no chills HENT: Negative for ear pain, nosebleeds, congestion, facial swelling, rhinorrhea, neck pain, neck stiffness and ear discharge.   Respiratory: Negative for cough, shortness of breath, wheezing  Cardiovascular: Negative for chest pain, palpitations and leg swelling.  Gastrointestinal: Negative for heartburn, abdominal pain, vomiting, diarrhea or consitpation Genitourinary: Negative for dysuria, urgency, frequency, hematuria Musculoskeletal: Negative for back pain or joint pain  + left hand pain Neurological: Negative for dizziness, seizures, syncope, focal weakness,  numbness and headaches.  Hematological: Does not bruise/bleed easily.  Psychiatric/Behavioral: Negative for hallucinations, confusion, dysphoric mood     Tolerating Diet: yes      DRUG ALLERGIES:   Allergies  Allergen Reactions  . Haldol [Haloperidol Lactate] Other (See Comments)    Pnt reports dystonia with med    VITALS:  Blood pressure (!) 132/106, pulse (!) 120, temperature 100.2 F (37.9 C), temperature source Oral, resp. rate 18, height 5\' 2"  (1.575 m), weight 59 kg, SpO2 100 %.  PHYSICAL EXAMINATION:  Constitutional: Appears well-developed and well-nourished. No distress. HENT: Normocephalic. Marland Kitchen Oropharynx is clear and moist.  Eyes: Conjunctivae and EOM are normal. PERRLA, no scleral icterus.  Neck: Normal ROM. Neck supple. No JVD. No tracheal deviation. CVS: RRR, S1/S2 +, no murmurs, no gallops, no carotid bruit.  Pulmonary: Effort and breath sounds normal, no stridor, rhonchi, wheezes, rales.  Abdominal: Soft. BS +,  no distension, tenderness, rebound or  guarding.  Musculoskeletal: Normal range of motion. No edema and no tenderness.  Neuro: Alert. CN 2-12 grossly intact. No focal deficits. Skin:lErythema is slowly improving however patient still with fluctuance and edema Psychiatric: Normal mood and affect.      LABORATORY PANEL:   CBC Recent Labs  Lab 10/26/17 0407  WBC 7.0  HGB 11.5*  HCT 33.1*  PLT 194   ------------------------------------------------------------------------------------------------------------------  Chemistries  Recent Labs  Lab 10/27/17 0458  NA 140  K 3.8  CL 102  CO2 30  GLUCOSE 103*  BUN 9  CREATININE 0.63  CALCIUM 9.2   ------------------------------------------------------------------------------------------------------------------  Cardiac Enzymes No results for input(s): TROPONINI in the last 168 hours. ------------------------------------------------------------------------------------------------------------------  RADIOLOGY:  Mr Hand Left W Wo Contrast  Result Date: 10/26/2017 CLINICAL DATA:  Left hand pain and swelling for the past 24-36 hours. History of IV heroin injection with overdose yesterday and Narcan administration to recover her. Mild leukocytosis. EXAM: MRI OF THE LEFT HAND WITHOUT AND WITH CONTRAST TECHNIQUE: Multiplanar, multisequence MR imaging of the left hand was performed before and after the administration of intravenous contrast. CONTRAST:  12mL MULTIHANCE GADOBENATE DIMEGLUMINE 529 MG/ML IV SOLN COMPARISON:  None. FINDINGS: Bones/Joint/Cartilage No marrow signal abnormality, fracture or joint effusion. Ligaments Noncontributory Muscles and Tendons Intact flexor and extensor tendons crossing the wrist and hand. No significant tenosynovitis. Soft tissues Deep to the extensor tendons of the second through fifth digits is and irregular enhancing fluid collection consistent with a soft tissue abscess measuring at least 3.7 x 0.8 x 3.1 cm (transverse by AP by [email or web address]),  series 24 image 29. Moderate degree of subcutaneous soft tissue edema consistent with cellulitis is otherwise noted of the dorsum of the  included wrist, hand and second through fifth digits. IMPRESSION: 1. Cellulitis of the dorsum of the included wrist, hand and second through fifth digits. 2. Deep to the extensor tendons of the second through fifth digits at the level of the metacarpals is an enhancing fluid collection measuring approximately 3.7 x 0.8 x 3.1 cm consistent with a soft tissue abscess. 3. No marrow signal abnormality suggestive of osteomyelitis. Electronically Signed   By: Tollie Ethavid  Kwon M.D.   On: 10/26/2017 02:06     ASSESSMENT AND PLAN:   32 year old female with tobacco dependence, heroin abuse and bipolar who initially presented to the emergency room after overdose on heroin and now has a left hand cellulitis.  1.  Sepsis: Patient ended with elevated white blood cell count, tachycardia and tachypnea Sepsis is due to left hand cellulitis/ abscess MRI confirms abscess.   I spoke with Dr. Hyacinth MeekerMiller this morning and due to ongoing swelling, pain and MRI evidence of abscess with low-grade fever this morning she will have I&D today. I discussed this with the patient.   2.  Left hand cellulitis/abscess: Continue vancomycin Blood cell count is improving. Continue to elevate hand and continue PRN pain medications  3.  Substance abuse heroin overdose: She has been eval by psychiatry.  Involuntary commitment papers have been discontinued.   Toxicology positive for amphetamines, benzodiazepines and marijuana   4.  Hyponatremia from sepsis: This has improved with IV fluids 5.Tobacco dependence: Patient is encouraged to quit smoking. Counseling was provided.. Nicotine patch and nicotine gum ordered as per patient's request  6.  Anxiety: Management as per psychiatry    Management plans discussed with the patient and she is in agreement.  CODE STATUS: Full  TOTAL TIME TAKING CARE  OF THIS PATIENT: 24 minutes.     POSSIBLE D/C 1-2 days, DEPENDING ON CLINICAL CONDITION.   Kerri Kovacik M.D on 10/27/2017 at 10:03 AM  Between 7am to 6pm - Pager - [phone] After 6pm go to [email or web address] - password [email or web address]  [facility] [place] [facility]  Office  [phone]  CC: Primary care physician; Patient, No Pcp Per  Note: This dictation was prepared with Dragon dictation along with smaller phrase technology. Any transcriptional errors that result from this process are unintentional.

## 2017-10-27 NOTE — Anesthesia Post-op Follow-up Note (Signed)
Anesthesia QCDR form completed.        

## 2017-10-28 ENCOUNTER — Encounter: Payer: Self-pay | Admitting: Specialist

## 2017-10-28 LAB — URINE DRUG SCREEN, QUALITATIVE (ARMC ONLY)
AMPHETAMINES, UR SCREEN: POSITIVE — AB
BENZODIAZEPINE, UR SCRN: POSITIVE — AB
Barbiturates, Ur Screen: NOT DETECTED
CANNABINOID 50 NG, UR ~~LOC~~: NOT DETECTED
Cocaine Metabolite,Ur ~~LOC~~: NOT DETECTED
MDMA (ECSTASY) UR SCREEN: NOT DETECTED
Methadone Scn, Ur: NOT DETECTED
Opiate, Ur Screen: POSITIVE — AB
PHENCYCLIDINE (PCP) UR S: NOT DETECTED
Tricyclic, Ur Screen: NOT DETECTED

## 2017-10-28 LAB — VANCOMYCIN, RANDOM: Vancomycin Rm: 6

## 2017-10-28 MED ORDER — CEPHALEXIN 500 MG PO CAPS: 500.0000 mg | ORAL_CAPSULE | Freq: Three times a day (TID) | ORAL | 0 refills | Status: AC

## 2017-10-28 MED ORDER — SULFAMETHOXAZOLE-TRIMETHOPRIM 800-160 MG PO TABS: 1.0000 | ORAL_TABLET | Freq: Two times a day (BID) | ORAL | 0 refills | Status: AC

## 2017-10-28 MED ORDER — VANCOMYCIN HCL IN DEXTROSE 1-5 GM/200ML-% IV SOLN
1000.0000 mg | Freq: Three times a day (TID) | INTRAVENOUS | Status: DC
  Filled 2017-10-28 (×2): qty 200

## 2017-10-28 MED ORDER — NICOTINE POLACRILEX 2 MG MT GUM: 2.0000 mg | CHEWING_GUM | OROMUCOSAL | 0 refills | Status: AC | PRN

## 2017-10-28 MED ORDER — VANCOMYCIN HCL IN DEXTROSE 1-5 GM/200ML-% IV SOLN
1000.0000 mg | Freq: Two times a day (BID) | INTRAVENOUS | Status: DC
  Administered 2017-10-28: 07:00:00 1000 mg via INTRAVENOUS
  Filled 2017-10-28: qty 200

## 2017-10-28 MED ORDER — OXYCODONE-ACETAMINOPHEN 5-325 MG PO TABS: 1.0000 | ORAL_TABLET | ORAL | 0 refills | Status: AC | PRN

## 2017-10-28 NOTE — Progress Notes (Signed)
PT Cancellation Note  Patient Details Name: Tonya ArgyleJennifer Lynn Helmstetter MRN: [id number] DOB: 12/27/1985   Cancelled Treatment:     Eval not completed due to pt not in room. EVS staff stated they stepped out of the room a few minutes ago. Nursing staff is aware.   Arvilla MeresSarah Leanna Hamid, SPT   Arvilla MeresSarah Yania Bogie 10/28/2017, 12:03 PM

## 2017-10-28 NOTE — Progress Notes (Signed)
Security alert paged for patient after RN unable to locate patient in facility. MD at bedside to remove drain and discharge patient. Staff and security unable to locate patient after searching campus. Charge RN notified local police department. Search attempt ceased at this time.

## 2017-10-28 NOTE — Consult Note (Signed)
Pharmacy Antibiotic Note  Tonya Huff is a 32 y.o. female admitted on 10/24/2017 with cellulitis.  Pharmacy has been consulted for vancomycin dosing.  Plan: Vanc level 6 this morning. Will adjust regimen to Vancomycin 1000mg   IV every 8 hours.  Goal trough 15-20 mcg/mL. Trough level ordered prior to 4th dose.   Ke: 0.174 T1/2: 5  Vd: 43   Height: 5\' 2"  (157.5 cm) Weight: 130 lb 1.1 oz (59 kg) IBW/kg (Calculated) : 50.1  Temp (24hrs), Avg:98.1 F (36.7 C), Min:97.6 F (36.4 C), Max:98.8 F (37.1 C)  Recent Labs  Lab 10/24/17 1907 10/25/17 0945 10/25/17 0950 10/25/17 1408 10/26/17 0407 10/27/17 0458 10/27/17 1029 10/28/17 0348  WBC 13.2*  --  9.9  --  7.0  --  9.5  --   CREATININE 0.57  --  0.61  --  0.57 0.63  --   --   LATICACIDVEN  --  3.5*  --  1.5  --   --   --   --   VANCORANDOM  --   --   --   --   --   --   --  6    Estimated Creatinine Clearance: 78.4 mL/min (by C-G formula based on SCr of 0.63 mg/dL).    Allergies  Allergen Reactions  . Haldol [Haloperidol Lactate] Other (See Comments)    Pnt reports dystonia with med    Antimicrobials this admission: kefelx 9/16 >> 9/16 bactrim 9/16 >> 9/16 clinda 9/16 one dose Vancomycin 9/16>> Cefazolin 9/18>>  Dose adjustments this admission: 9/19 AM vanc random 6. Dose changed to Vancomycin 1g IV every 8 hours.    Microbiology results: 9/16 BCx: NGTD  Thank you for allowing pharmacy to be a part of this patient's care.  Gardner CandleSheema M Aaliah Jorgenson, PharmD, BCPS Clinical Pharmacist 10/28/2017 7:46 AM

## 2017-10-28 NOTE — Discharge Summary (Signed)
[facility] - [place] at [facility]   PATIENT NAME: Tonya Huff    MR#:  [id number]  DATE OF BIRTH:  1985/12/14  DATE OF ADMISSION:  10/24/2017 ADMITTING PHYSICIAN: Adrian Saran, MD  DATE OF DISCHARGE: 10/28/2017  PRIMARY CARE PHYSICIAN: Patient, No Pcp Per    ADMISSION DIAGNOSIS:  Cellulitis of left upper extremity [L03.114] Sepsis, due to unspecified organism ([name]) [A41.9] Narcotic overdose, undetermined intent, initial encounter ([name]) [T40.604A]  DISCHARGE DIAGNOSIS:  Principal Problem:   Adjustment disorder with mixed anxiety and depressed mood Active Problems:   Sepsis ([name])   Amphetamine abuse ([name])   Cellulitis   Benzodiazepine abuse ([name])   Acute delirium   SECONDARY DIAGNOSIS:   Past Medical History:  Diagnosis Date  . Anxiety   . Bipolar 1 disorder ([name])   . Diverticulitis   . Kidney infection   . PID (pelvic inflammatory disease)     HOSPITAL COURSE:  32 year old female with tobacco dependence, heroin abuse and bipolar who initially presented to the emergency room after overdose on heroin and now has a left hand cellulitis.  1. Sepsis: Patient presented with elevated white blood cell count, tachycardia and tachypnea Sepsis is due to left hand cellulitis/ abscess MRI confirms abscess.   Has resolved   2. Left hand cellulitis/abscess: He underwent incision and drainage.  She will be discharged on oral Keflex and clindamycin.  She will have outpatient follow-up with Dr. Hyacinth Meeker.     3. Substance abuse heroin overdose: She was been eval by psychiatry.  Involuntary commitment papers filled out in the emergency room and have been been discontinued.   Toxicology positive for amphetamines, benzodiazepines and marijuana   4. Hyponatremia from sepsis: This has improved with IV fluids 5.Tobacco dependence: Patient is encouraged to quit smoking. Counseling was provided.. Nicotine patch and nicotine gum ordered as per patient's  request    DISCHARGE CONDITIONS AND DIET:   Stable for discharge on regular diet  CONSULTS OBTAINED:  Treatment Team:  Deeann Saint, MD Clapacs, Jackquline Denmark, MD  DRUG ALLERGIES:   Allergies  Allergen Reactions  . Haldol [Haloperidol Lactate] Other (See Comments)    Pnt reports dystonia with med    DISCHARGE MEDICATIONS:   Allergies as of 10/28/2017      Reactions   Haldol [haloperidol Lactate] Other (See Comments)   Pnt reports dystonia with med      Medication List    TAKE these medications   cephALEXin 500 MG capsule Commonly known as:  KEFLEX Take 1 capsule (500 mg total) by mouth 3 (three) times daily for 10 days.   gabapentin 300 MG capsule Commonly known as:  NEURONTIN Take 3 capsules (900 mg total) by mouth 3 (three) times daily.   lithium carbonate 300 MG capsule Take 1 capsule (300 mg total) by mouth 3 (three) times daily with meals.   nicotine polacrilex 2 MG gum Commonly known as:  NICORETTE Take 1 each (2 mg total) by mouth as needed for smoking cessation.   oxyCODONE-acetaminophen 5-325 MG tablet Commonly known as:  PERCOCET/ROXICET Take 1-2 tablets by mouth every 4 (four) hours as needed for severe pain.   sulfamethoxazole-trimethoprim 800-160 MG tablet Commonly known as:  BACTRIM DS,SEPTRA DS Take 1 tablet by mouth 2 (two) times daily.   traZODone 150 MG tablet Commonly known as:  DESYREL Take 1 tablet (150 mg total) by mouth at bedtime.         Today   CHIEF COMPLAINT:   Patient has improvement  after I&D.   VITAL SIGNS:  Blood pressure 115/70, pulse (!) 114, temperature 98.7 F (37.1 C), temperature source Oral, resp. rate (!) 21, height 5\' 2"  (1.575 m), weight 59 kg, SpO2 97 %.   REVIEW OF SYSTEMS:  Review of Systems  Constitutional: Negative.  Negative for chills, fever and malaise/fatigue.  HENT: Negative.  Negative for ear discharge, ear pain, hearing loss, nosebleeds and sore throat.   Eyes: Negative.  Negative for  blurred vision and pain.  Respiratory: Negative.  Negative for cough, hemoptysis, shortness of breath and wheezing.   Cardiovascular: Negative.  Negative for chest pain, palpitations and leg swelling.  Gastrointestinal: Negative.  Negative for abdominal pain, blood in stool, diarrhea, nausea and vomiting.  Genitourinary: Negative.  Negative for dysuria.  Musculoskeletal: Negative for back pain.       Left hand wrapped  Skin:       Redness improved.   Neurological: Negative for dizziness, tremors, speech change, focal weakness, seizures and headaches.  Endo/Heme/Allergies: Negative.  Does not bruise/bleed easily.  Psychiatric/Behavioral: Negative.  Negative for depression, hallucinations and suicidal ideas.     PHYSICAL EXAMINATION:  GENERAL:  32 y.o.-year-old patient lying in the bed with no acute distress.  NECK:  Supple, no jugular venous distention. No thyroid enlargement, no tenderness.  LUNGS: Normal breath sounds bilaterally, no wheezing, rales,rhonchi  No use of accessory muscles of respiration.  CARDIOVASCULAR: S1, S2 normal. No murmurs, rubs, or gallops.  ABDOMEN: Soft, non-tender, non-distended. Bowel sounds present. No organomegaly or mass.  EXTREMITIES: No pedal edema, cyanosis, or clubbing.  PSYCHIATRIC: The patient is alert and oriented x 3.  SKIN: left hand wrapped  DATA REVIEW:   CBC Recent Labs  Lab 10/27/17 1029  WBC 9.5  HGB 12.2  HCT 34.9*  PLT 264    Chemistries  Recent Labs  Lab 10/27/17 0458  NA 140  K 3.8  CL 102  CO2 30  GLUCOSE 103*  BUN 9  CREATININE 0.63  CALCIUM 9.2    Cardiac Enzymes No results for input(s): TROPONINI in the last 168 hours.  Microbiology Results  @MICRORSLT48 @  RADIOLOGY:  No results found.    Allergies as of 10/28/2017      Reactions   Haldol [haloperidol Lactate] Other (See Comments)   Pnt reports dystonia with med      Medication List    TAKE these medications   cephALEXin 500 MG capsule Commonly  known as:  KEFLEX Take 1 capsule (500 mg total) by mouth 3 (three) times daily for 10 days.   gabapentin 300 MG capsule Commonly known as:  NEURONTIN Take 3 capsules (900 mg total) by mouth 3 (three) times daily.   lithium carbonate 300 MG capsule Take 1 capsule (300 mg total) by mouth 3 (three) times daily with meals.   nicotine polacrilex 2 MG gum Commonly known as:  NICORETTE Take 1 each (2 mg total) by mouth as needed for smoking cessation.   oxyCODONE-acetaminophen 5-325 MG tablet Commonly known as:  PERCOCET/ROXICET Take 1-2 tablets by mouth every 4 (four) hours as needed for severe pain.   sulfamethoxazole-trimethoprim 800-160 MG tablet Commonly known as:  BACTRIM DS,SEPTRA DS Take 1 tablet by mouth 2 (two) times daily.   traZODone 150 MG tablet Commonly known as:  DESYREL Take 1 tablet (150 mg total) by mouth at bedtime.         Management plans discussed with the patient and she is in agreement. Stable for discharge   Patient should  follow up with dr Hyacinth Meeker  CODE STATUS:     Code Status Orders  (From admission, onward)         Start     Ordered   10/25/17 1355  Full code  Continuous     10/25/17 1354        Code Status History    Date Active Date Inactive Code Status Order ID Comments User Context   10/30/2016 1232 10/31/2016 1658 Full Code [id number]  Ramonita Lab, MD Inpatient   10/07/2016 2313 10/09/2016 2106 Full Code [id number]  Audery Amel, MD Inpatient   10/07/2016 2313 10/07/2016 2313 Full Code [id number]  Audery Amel, MD Inpatient   10/07/2016 0727 10/07/2016 2231 Full Code [id number]  Jeanmarie Plant, MD ED      TOTAL TIME TAKING CARE OF THIS PATIENT: 38 minutes.    Note: This dictation was prepared with Dragon dictation along with smaller phrase technology. Any transcriptional errors that result from this process are unintentional.  Isma Tietje M.D on 10/28/2017 at 10:56 AM  Between 7am to 6pm - Pager - [phone] After 6pm go to  [email or web address] - password [email or web address]  [facility] [place] [facility]  Office  [phone]  CC: Primary care physician; Patient, No Pcp Per

## 2017-10-28 NOTE — Progress Notes (Signed)
Subjective: 1 Day Post-Op Procedure(s) (LRB): IRRIGATION AND DEBRIDEMENT EXTREMITY (Left)    Patient reports pain as moderate.  Objective:   VITALS:   Vitals:   10/28/17 0407 10/28/17 1005  BP: 103/78 115/70  Pulse: (!) 109 (!) 114  Resp: (!) 21   Temp: 98.1 F (36.7 C) 98.7 F (37.1 C)  SpO2: 99% 97%    Neurologically intact ABD soft Neurovascular intact Sensation intact distally Intact pulses distally Dorsiflexion/Plantar flexion intact Incision: no drainage  LABS Recent Labs    10/26/17 0407 10/27/17 1029  HGB 11.5* 12.2  HCT 33.1* 34.9*  WBC 7.0 9.5  PLT 194 264    Recent Labs    10/26/17 0407 10/27/17 0458  NA 137 140  K 3.2* 3.8  BUN 7 9  CREATININE 0.57 0.63  GLUCOSE 99 103*    Recent Labs    10/27/17 1029  INR 0.95     Assessment/Plan: 1 Day Post-Op Procedure(s) (LRB): IRRIGATION AND DEBRIDEMENT EXTREMITY (Left)   [facility]

## 2017-10-28 NOTE — Progress Notes (Signed)
Patient with concern that she is not able to leave unit. Patient attempted to go outside earlier in shift and was redirected by unit staff back to her room for concerns of safety. Patient again agitated and requesting to leave room to visit vending machine. This RN offered to accompany patient, patient refused stating she did not need a babysitter. Patient returned to her room. Patient again approached the unit desk with MD present stating she was leaving the unit without permission. MD advised RN from a medical standpoint patient is ok to travel off the unit. Education provided to patient regarding risks of leaving unit unsupervised by health care profession. Patient decided to leave against RN's recommendation. MD and Unit Manager made aware.

## 2017-10-28 NOTE — Progress Notes (Signed)
Patient and boyfriend asked RN that they ar going to walk around the nursing station. Patient was made aware that she could not go out and she verbalized understanding. RN came out and patient was not walking on the floor. Patient went out with her boyfriend and the NT went and found her. Patient told NT that RN allowed her to go outside. When confronted by RN, patient stated that she been here for five day. RN told her that she cannot go out because we are worry about her safety. Patient said that I am saying that she is a piece of junk that she wanted to talk to the charge nurse. The charge nurse came and let her know that she can go out.  RN came out later from another patient's room and she was not in her room. RN asked NT if she new where the patient was and the Director told the RN that she was going to buy a snack from the vending machine. Patient did not come back to her room. Another RN went outside to look for her and she was nowhere to be found. Security was called to look for patient but they could not find her. Patient left the floor with her IV and never return.

## 2017-10-28 NOTE — Evaluation (Signed)
Occupational Therapy Evaluation Patient Details Name: Tonya ArgyleJennifer Lynn Huff MRN: [id number] DOB: 05/01/1985 Today's Date: 10/28/2017    History of Present Illness Pt is 32 year old female with cellulitis s/p L hand irrigation and debridement and is soft short arm cast.  Hx of adjustment disorder.   Clinical Impression   Pt is 32 y/o female who presented with cellulitis and underwent L hand irrigation and debridement by Dr Hyacinth MeekerMiller and has a soft short arm cast in place with orders to move fingers frequently and to keep L hand elevated above her heart to help with swelling.  Per pt report, Dr Hyacinth MeekerMiller removed sling that was in her room.  Assessed finger movement and color of L hand which were all good and no c/o numbness or tingling.  Reviewed rec for keeping L hand elevated since she was holding it at her side and then in her lap when sitting EOB.  Rec OT continue to follow and see pt each day until D/C'd home and follow rec by Dr Hyacinth MeekerMiller.  Pt is able to complete ADLs with no assist or cues using one handed tech and is not in need of any self care skills training.    Follow Up Recommendations  Follow surgeon's recommendation for DC plan and follow-up therapies;Other (comment)(may need OT HH or hand therapy which is deferred to Dr Hyacinth MeekerMiller for rec)    Equipment Recommendations       Recommendations for Other Services       Precautions / Restrictions Precautions Precautions: Other (comment) Precaution Comments: LUE to be kept elevated above heart and Dr Hyacinth MeekerMiller removed sling that was ordered.  No restrictions except to move fingers frequently.   Restrictions Weight Bearing Restrictions: No      Mobility Bed Mobility                  Transfers                      Balance                                           ADL either performed or assessed with clinical judgement   ADL Overall ADL's : At baseline                                       General ADL Comments: Pt able to use one handed tech to feed self, get dressed including socks and shoes and pants and is not in need of any ADL training and will monitor L hand for orders for exercises per Dr Hyacinth MeekerMiller when able but most likely will have soft short arm cast in place while in hospital.  Reviewed importance of keeping LUE and hand elevated bove heart for edema control but pt was not cooperative with recommendations.  Will monitor fingers and edema while in hospital.     Vision Patient Visual Report: No change from baseline       Perception     Praxis      Pertinent Vitals/Pain Pain Assessment: 0-10 Pain Score: 7  Pain Location: L hand Pain Descriptors / Indicators: Constant;Discomfort;Operative site guarding Pain Intervention(s): Monitored during session;Premedicated before session     Hand Dominance Right   Extremity/Trunk Assessment Upper Extremity Assessment Upper Extremity Assessment:  LUE deficits/detail LUE Deficits / Details: L hand in soft short arm cast with IV in place; intact sensation and good color in L finger tips and able to move actively.  Reminded to elevate LUE but she was not cooperative and continue to keep L UE and hand in her lap sitting EOB.    Lower Extremity Assessment Lower Extremity Assessment: Defer to PT evaluation       Communication Communication Communication: No difficulties   Cognition Arousal/Alertness: Awake/alert Behavior During Therapy: Restless Overall Cognitive Status: Within Functional Limits for tasks assessed                                     General Comments       Exercises     Shoulder Instructions      Home Living Family/patient expects to be discharged to:: Private residence Living Arrangements: Spouse/significant other Available Help at Discharge: Family                         Home Equipment: None   Additional Comments: Pt was not cooperative to questions about home set  up and stated that her boyfriend's mother was a Engineer, civil (consulting) and would help take care of her if needed and check on her. She also said she would refuse PT and PT Judeth Cornfield updated about this and she did well with ambulating and standing on one leg to get dressed in bathroom and most likely will not need PT.      Prior Functioning/Environment Level of Independence: Independent                 OT Problem List: Pain;Decreased strength;Decreased range of motion      OT Treatment/Interventions: Therapeutic exercise;Therapeutic activities    OT Goals(Current goals can be found in the care plan section) Acute Rehab OT Goals Patient Stated Goal: "to get out of here" OT Goal Formulation: With patient/family Time For Goal Achievement: 11/11/17 Potential to Achieve Goals: Fair ADL Goals Pt/caregiver will Perform Home Exercise Program: Left upper extremity  OT Frequency: Min 3X/week   Barriers to D/C:            Co-evaluation              AM-PAC PT "6 Clicks" Daily Activity     Outcome Measure Help from another person eating meals?: None Help from another person taking care of personal grooming?: None Help from another person toileting, which includes using toliet, bedpan, or urinal?: None Help from another person bathing (including washing, rinsing, drying)?: None Help from another person to put on and taking off regular upper body clothing?: None Help from another person to put on and taking off regular lower body clothing?: None 6 Click Score: 24   End of Session    Activity Tolerance: Patient tolerated treatment well Patient left: in bed  OT Visit Diagnosis: Pain;Muscle weakness (generalized) (M62.81) Pain - Right/Left: Left Pain - part of body: Hand                Time: 1020-1050 OT Time Calculation (min): 30 min Charges:  OT General Charges $OT Visit: 1 Visit OT Evaluation $OT Eval Low Complexity: 1 Low OT Treatments $Therapeutic Exercise: 8-22 mins  Susanne Borders, OTR/L ascom [phone] 10/28/17, 11:15 AM

## 2017-10-30 LAB — CULTURE, BLOOD (ROUTINE X 2)
Culture: NO GROWTH
Culture: NO GROWTH
SPECIAL REQUESTS: ADEQUATE
Special Requests: ADEQUATE

## 2017-11-02 LAB — AEROBIC/ANAEROBIC CULTURE W GRAM STAIN (SURGICAL/DEEP WOUND)

## 2017-11-02 LAB — AEROBIC/ANAEROBIC CULTURE (SURGICAL/DEEP WOUND)

## 2020-05-22 IMAGING — MR MR [PERSON_NAME]*[PERSON_NAME]* WO/W CM
6 of 8 series · 34 of 40 positions shown · IV contrast (multihance)
Comparison: None.

CLINICAL DATA: Left hand pain and swelling for the past 24-36
hours. History of IV heroin injection with overdose yesterday and
Narcan administration to recover her. Mild leukocytosis.

EXAM:
MRI OF THE LEFT HAND WITHOUT AND WITH CONTRAST
TECHNIQUE: Multiplanar, multisequence MR imaging of the left hand was performed
before and after the administration of intravenous contrast.
CONTRAST:  12mL MULTIHANCE GADOBENATE DIMEGLUMINE 529 MG/ML IV SOLN

[Series 17: T1 · oblique · left · 4.0mm · 0.31mm/px · 7 of 42 slices shown (1 of 2)]
[im 1/42]
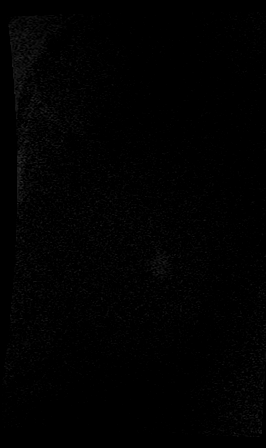
[im 7/42]
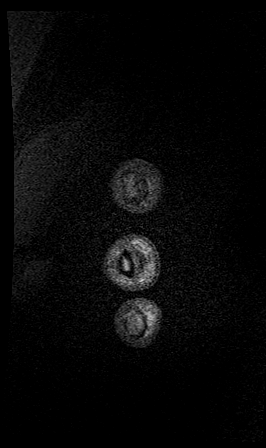
[im 14/42]
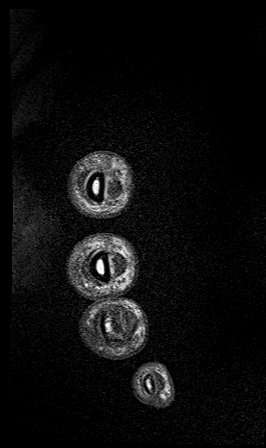
[im 21/42]
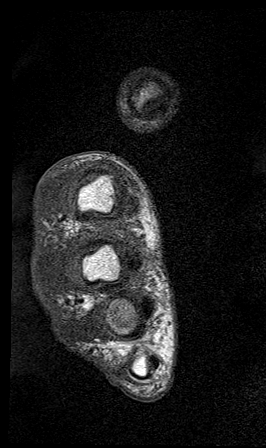
[im 28/42]
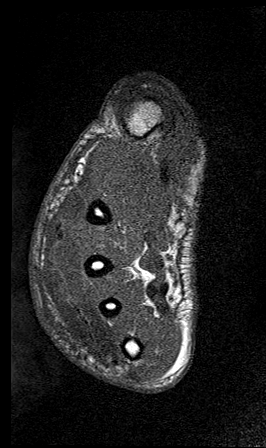
[im 35/42]
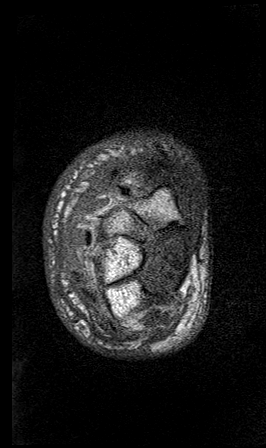
[im 42/42]
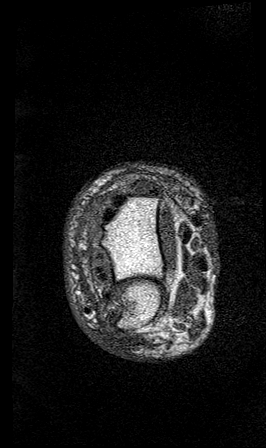

[Series 18: T2 fat-sat · oblique · left · 4.0mm · 0.44mm/px · 6 of 42 slices shown]
[im 1/42]
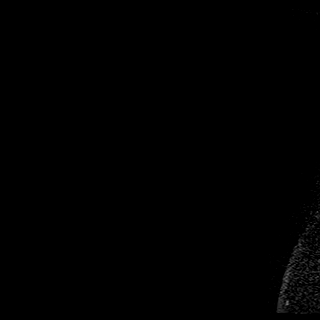
[im 9/42]
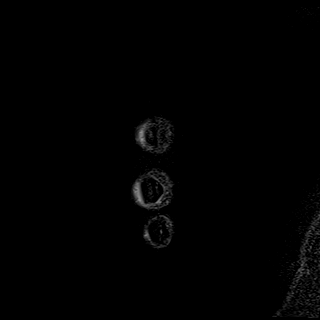
[im 17/42]
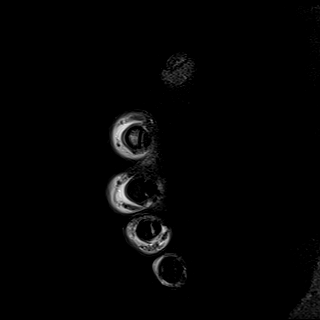
[im 25/42]
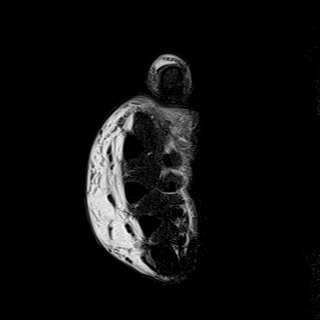
[im 33/42]
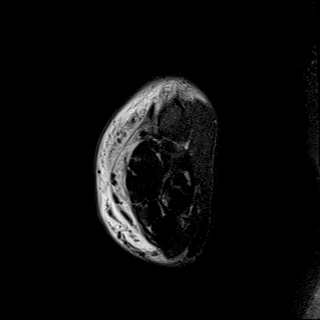
[im 42/42]
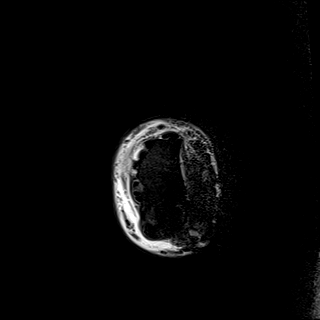

[Series 19: T1 · coronal · left · 3.0mm · 0.33mm/px · 3 of 18 slices shown (2 of 2)]
[im 1/18]
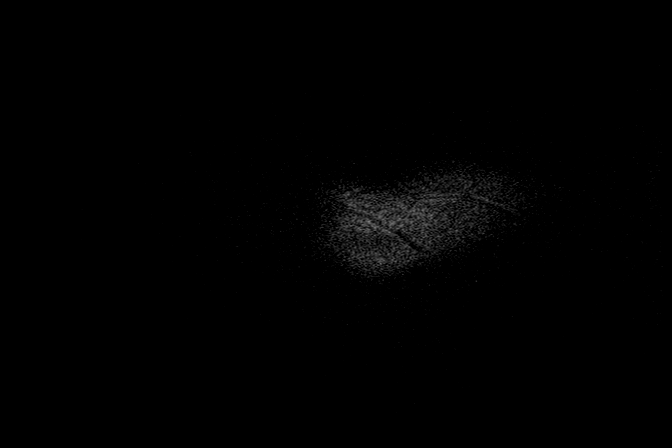
[im 9/18]
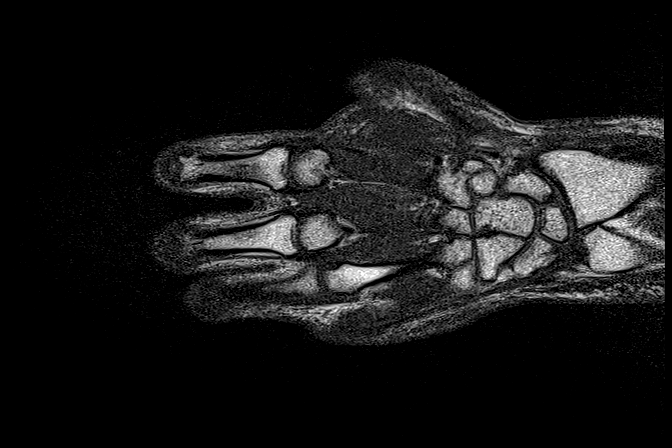
[im 18/18]
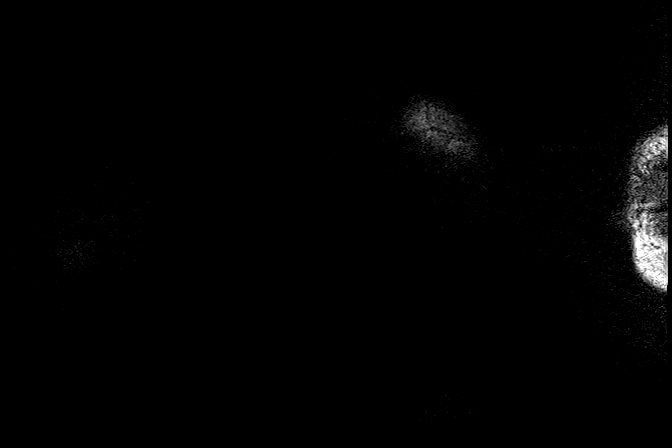

[Series 21: PD fat-sat · oblique · left · 3.0mm · 0.33mm/px · 6 of 36 slices shown]
[im 1/36]
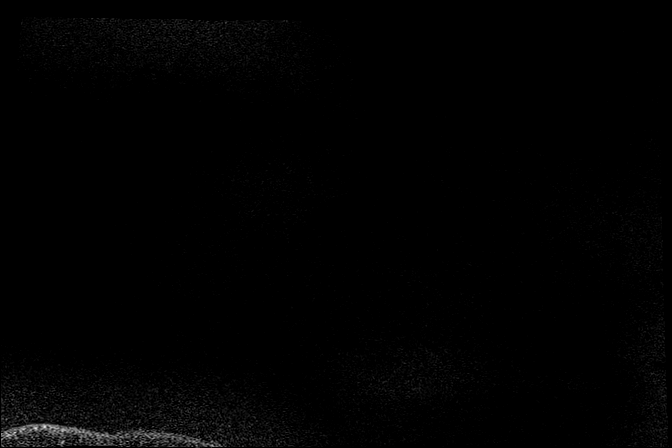
[im 8/36]
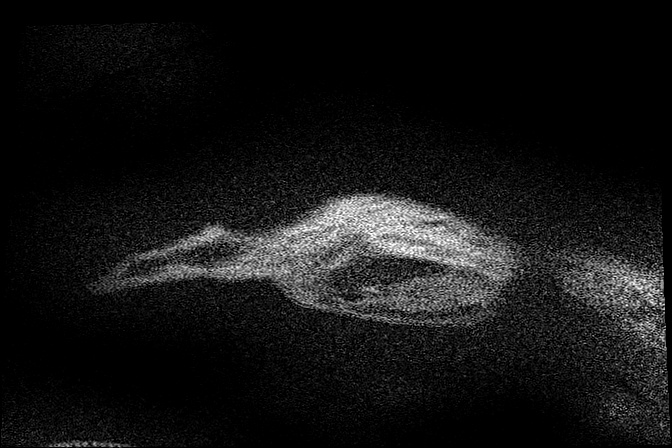
[im 15/36]
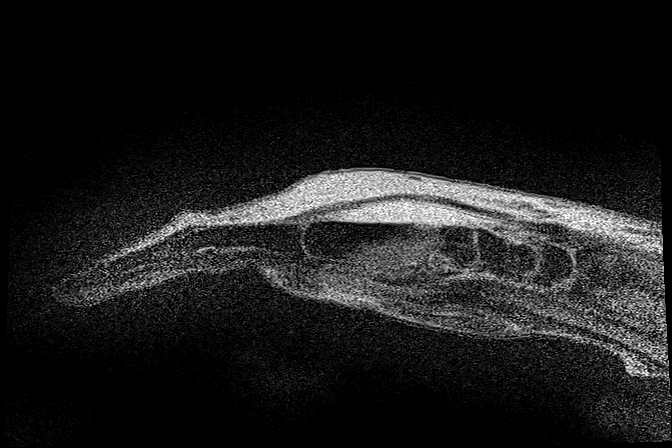
[im 22/36]
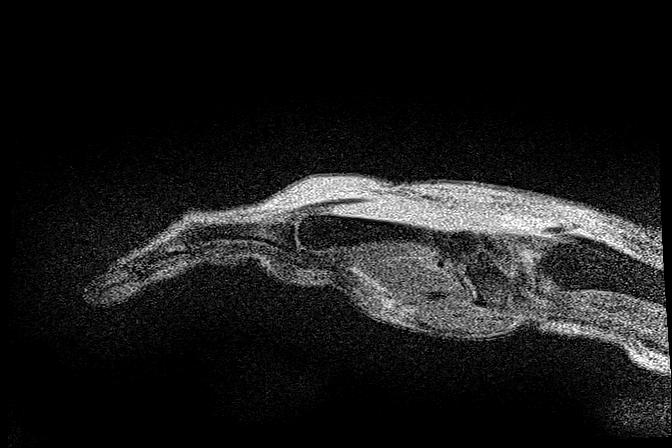
[im 29/36]
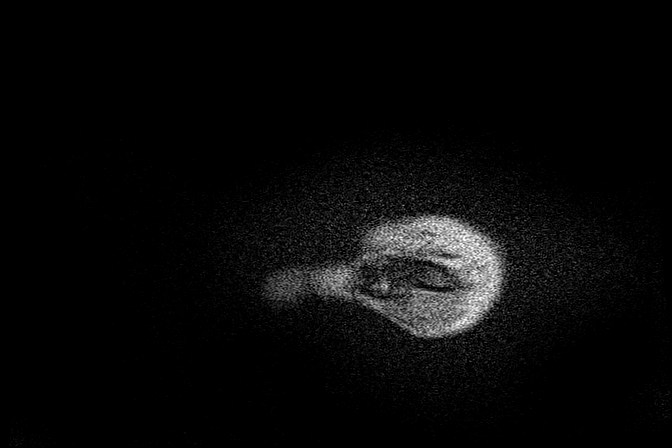
[im 36/36]
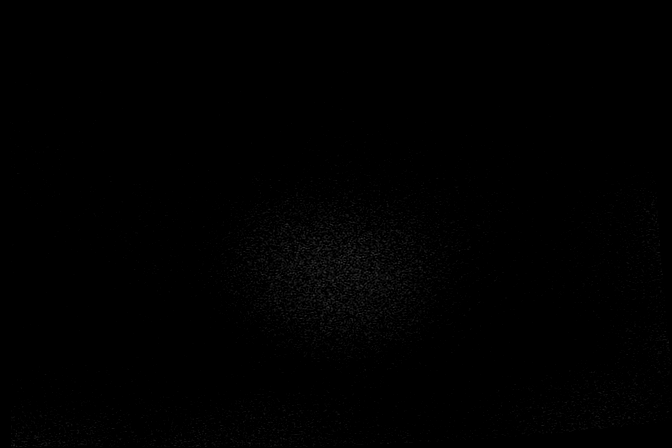

[Series 23: T1 fat-sat · oblique · non-contrast · left · 4.0mm · 0.55mm/px · 6 of 42 slices shown]
[im 1/42]
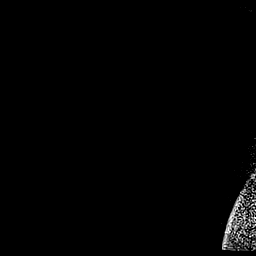
[im 9/42]
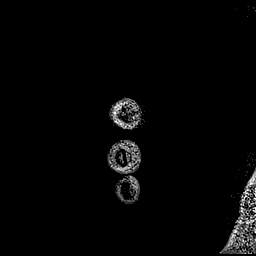
[im 17/42]
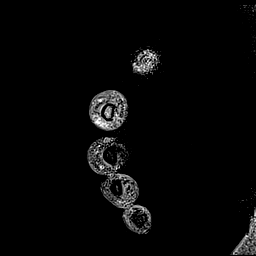
[im 25/42]
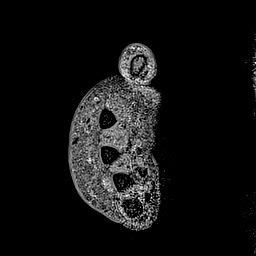
[im 33/42]
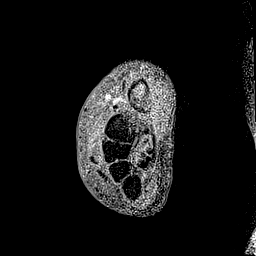
[im 42/42]
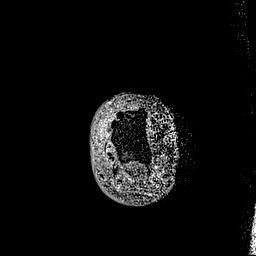

[Series 24: T1 fat-sat post-contrast · oblique · left · 4.0mm · 0.55mm/px · 6 of 42 slices shown]
[im 1/42]
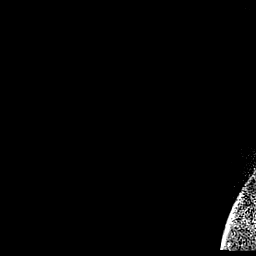
[im 9/42]
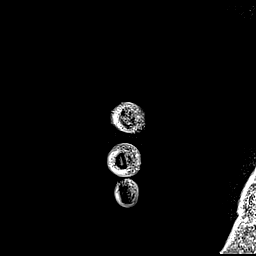
[im 17/42]
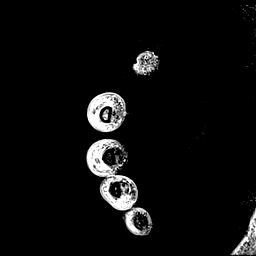
[im 25/42]
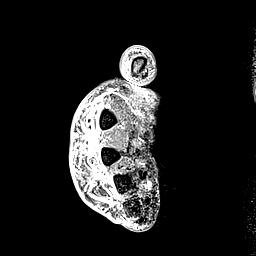
[im 33/42]
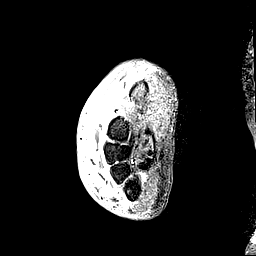
[im 42/42]
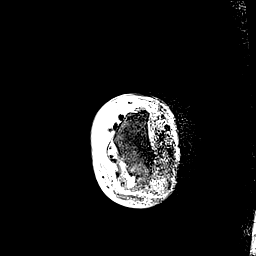

[34 of 40 positions shown; findings below may reference images not displayed]

FINDINGS: Bones/Joint/Cartilage

No marrow signal abnormality, fracture or joint effusion.

Ligaments

Noncontributory

Muscles and Tendons

Intact flexor and extensor tendons crossing the wrist and hand. No
significant tenosynovitis.

Soft tissues

Deep to the extensor tendons of the second through fifth digits is
and irregular enhancing fluid collection consistent with a soft
tissue abscess measuring at least 3.7 x 0.8 x 3.1 cm (transverse by
AP by craniocaudad), series 24 image 29. Moderate degree of
subcutaneous soft tissue edema consistent with cellulitis is
otherwise noted of the dorsum of the included wrist, hand and second
through fifth digits.
IMPRESSION: 1. Cellulitis of the dorsum of the included wrist, hand and second
through fifth digits.
2. Deep to the extensor tendons of the second through fifth digits
at the level of the metacarpals is an enhancing fluid collection
measuring approximately 3.7 x 0.8 x 3.1 cm consistent with a soft
tissue abscess.
3. No marrow signal abnormality suggestive of osteomyelitis.
